# Patient Record
Sex: Female | Born: 1955
Health system: Southern US, Community
[De-identification: ages and names within clinical notes are randomized; demographics above are authoritative.]

## PROBLEM LIST (undated history)

## (undated) DIAGNOSIS — R17 Unspecified jaundice: Secondary | ICD-10-CM

## (undated) DIAGNOSIS — Z87898 Personal history of other specified conditions: Secondary | ICD-10-CM

## (undated) DIAGNOSIS — B54 Unspecified malaria: Secondary | ICD-10-CM

## (undated) HISTORY — DX: Unspecified malaria: B54

## (undated) HISTORY — DX: Personal history of other specified conditions: Z87.898

## (undated) HISTORY — DX: Unspecified jaundice: R17

---

## 1978-09-25 HISTORY — PX: GYNECOLOGIC CRYOSURGERY: SHX857

## 1989-09-25 DIAGNOSIS — B54 Unspecified malaria: Secondary | ICD-10-CM

## 1989-09-25 HISTORY — DX: Unspecified malaria: B54

## 1999-05-12 ENCOUNTER — Other Ambulatory Visit: Admission: RE | Admit: 1999-05-12 | Discharge: 1999-05-12 | Payer: Self-pay | Admitting: Obstetrics and Gynecology

## 1999-06-02 ENCOUNTER — Ambulatory Visit (HOSPITAL_COMMUNITY): Admission: RE | Admit: 1999-06-02 | Discharge: 1999-06-02 | Payer: Self-pay | Admitting: Obstetrics and Gynecology

## 1999-06-02 ENCOUNTER — Encounter: Payer: Self-pay | Admitting: Obstetrics and Gynecology

## 2000-07-30 ENCOUNTER — Other Ambulatory Visit: Admission: RE | Admit: 2000-07-30 | Discharge: 2000-07-30 | Payer: Self-pay | Admitting: Obstetrics and Gynecology

## 2001-04-11 ENCOUNTER — Encounter: Payer: Self-pay | Admitting: Obstetrics and Gynecology

## 2001-04-11 ENCOUNTER — Ambulatory Visit (HOSPITAL_COMMUNITY): Admission: RE | Admit: 2001-04-11 | Discharge: 2001-04-11 | Payer: Self-pay | Admitting: Obstetrics and Gynecology

## 2001-09-02 ENCOUNTER — Other Ambulatory Visit: Admission: RE | Admit: 2001-09-02 | Discharge: 2001-09-02 | Payer: Self-pay | Admitting: Obstetrics and Gynecology

## 2002-12-11 ENCOUNTER — Other Ambulatory Visit: Admission: RE | Admit: 2002-12-11 | Discharge: 2002-12-11 | Payer: Self-pay | Admitting: Obstetrics and Gynecology

## 2003-03-05 ENCOUNTER — Ambulatory Visit (HOSPITAL_COMMUNITY): Admission: RE | Admit: 2003-03-05 | Discharge: 2003-03-05 | Payer: Self-pay | Admitting: Obstetrics and Gynecology

## 2003-03-05 ENCOUNTER — Encounter: Payer: Self-pay | Admitting: Obstetrics and Gynecology

## 2003-12-28 ENCOUNTER — Other Ambulatory Visit: Admission: RE | Admit: 2003-12-28 | Discharge: 2003-12-28 | Payer: Self-pay | Admitting: Obstetrics and Gynecology

## 2004-11-17 ENCOUNTER — Ambulatory Visit (HOSPITAL_COMMUNITY): Admission: RE | Admit: 2004-11-17 | Discharge: 2004-11-17 | Payer: Self-pay | Admitting: Obstetrics and Gynecology

## 2005-01-12 ENCOUNTER — Other Ambulatory Visit: Admission: RE | Admit: 2005-01-12 | Discharge: 2005-01-12 | Payer: Self-pay | Admitting: *Deleted

## 2005-11-20 ENCOUNTER — Ambulatory Visit: Admission: RE | Admit: 2005-11-20 | Discharge: 2005-11-20 | Payer: Self-pay | Admitting: Family Medicine

## 2005-12-11 ENCOUNTER — Ambulatory Visit (HOSPITAL_COMMUNITY): Admission: RE | Admit: 2005-12-11 | Discharge: 2005-12-11 | Payer: Self-pay | Admitting: Obstetrics & Gynecology

## 2006-06-28 ENCOUNTER — Other Ambulatory Visit: Admission: RE | Admit: 2006-06-28 | Discharge: 2006-06-28 | Payer: Self-pay | Admitting: Obstetrics & Gynecology

## 2007-01-10 ENCOUNTER — Ambulatory Visit (HOSPITAL_COMMUNITY): Admission: RE | Admit: 2007-01-10 | Discharge: 2007-01-10 | Payer: Self-pay | Admitting: Obstetrics & Gynecology

## 2007-05-16 ENCOUNTER — Ambulatory Visit (HOSPITAL_COMMUNITY): Admission: RE | Admit: 2007-05-16 | Discharge: 2007-05-16 | Payer: Self-pay | Admitting: Pediatrics

## 2007-07-08 ENCOUNTER — Other Ambulatory Visit: Admission: RE | Admit: 2007-07-08 | Discharge: 2007-07-08 | Payer: Self-pay | Admitting: Obstetrics & Gynecology

## 2008-02-10 ENCOUNTER — Ambulatory Visit (HOSPITAL_COMMUNITY): Admission: RE | Admit: 2008-02-10 | Discharge: 2008-02-10 | Payer: Self-pay | Admitting: Obstetrics & Gynecology

## 2008-07-20 ENCOUNTER — Other Ambulatory Visit: Admission: RE | Admit: 2008-07-20 | Discharge: 2008-07-20 | Payer: Self-pay | Admitting: Obstetrics & Gynecology

## 2008-07-26 HISTORY — PX: COLONOSCOPY: SHX174

## 2008-07-27 ENCOUNTER — Ambulatory Visit: Payer: Self-pay | Admitting: Internal Medicine

## 2008-08-13 ENCOUNTER — Ambulatory Visit: Payer: Self-pay | Admitting: Internal Medicine

## 2008-08-13 ENCOUNTER — Encounter: Payer: Self-pay | Admitting: Internal Medicine

## 2008-08-17 ENCOUNTER — Encounter: Payer: Self-pay | Admitting: Internal Medicine

## 2009-02-15 ENCOUNTER — Ambulatory Visit (HOSPITAL_COMMUNITY): Admission: RE | Admit: 2009-02-15 | Discharge: 2009-02-15 | Payer: Self-pay | Admitting: Obstetrics & Gynecology

## 2010-03-21 ENCOUNTER — Ambulatory Visit (HOSPITAL_COMMUNITY): Admission: RE | Admit: 2010-03-21 | Discharge: 2010-03-21 | Payer: Self-pay | Admitting: Obstetrics & Gynecology

## 2010-10-16 ENCOUNTER — Encounter: Payer: Self-pay | Admitting: Pediatrics

## 2011-03-14 ENCOUNTER — Other Ambulatory Visit: Payer: Self-pay | Admitting: Obstetrics & Gynecology

## 2011-03-14 DIAGNOSIS — Z1231 Encounter for screening mammogram for malignant neoplasm of breast: Secondary | ICD-10-CM

## 2011-03-30 ENCOUNTER — Ambulatory Visit (HOSPITAL_COMMUNITY): Payer: Self-pay

## 2011-03-30 ENCOUNTER — Ambulatory Visit (HOSPITAL_COMMUNITY)
Admission: RE | Admit: 2011-03-30 | Discharge: 2011-03-30 | Disposition: A | Discharge status: Home / Self Care | Payer: 59 | Source: Ambulatory Visit | Attending: Obstetrics & Gynecology | Admitting: Obstetrics & Gynecology

## 2011-03-30 DIAGNOSIS — Z1231 Encounter for screening mammogram for malignant neoplasm of breast: Secondary | ICD-10-CM

## 2012-01-04 ENCOUNTER — Encounter: Payer: Self-pay | Admitting: Internal Medicine

## 2012-04-15 ENCOUNTER — Other Ambulatory Visit: Payer: Self-pay | Admitting: Obstetrics & Gynecology

## 2012-04-15 DIAGNOSIS — Z1231 Encounter for screening mammogram for malignant neoplasm of breast: Secondary | ICD-10-CM

## 2012-05-06 ENCOUNTER — Ambulatory Visit (HOSPITAL_COMMUNITY)
Admission: RE | Admit: 2012-05-06 | Discharge: 2012-05-06 | Disposition: A | Discharge status: Home / Self Care | Payer: 59 | Source: Ambulatory Visit | Attending: Obstetrics & Gynecology | Admitting: Obstetrics & Gynecology

## 2012-05-06 DIAGNOSIS — Z1231 Encounter for screening mammogram for malignant neoplasm of breast: Secondary | ICD-10-CM | POA: Insufficient documentation

## 2012-11-06 ENCOUNTER — Encounter: Payer: Self-pay | Admitting: Certified Nurse Midwife

## 2012-12-16 ENCOUNTER — Ambulatory Visit: Payer: Self-pay | Admitting: Obstetrics & Gynecology

## 2012-12-25 ENCOUNTER — Ambulatory Visit: Payer: Self-pay | Admitting: Obstetrics & Gynecology

## 2012-12-30 ENCOUNTER — Ambulatory Visit (INDEPENDENT_AMBULATORY_CARE_PROVIDER_SITE_OTHER): Payer: 59 | Admitting: Obstetrics & Gynecology

## 2012-12-30 ENCOUNTER — Encounter: Payer: Self-pay | Admitting: Obstetrics & Gynecology

## 2012-12-30 VITALS — BP 104/64 | Ht 66.5 in | Wt 135.6 lb

## 2012-12-30 DIAGNOSIS — Z01419 Encounter for gynecological examination (general) (routine) without abnormal findings: Secondary | ICD-10-CM

## 2012-12-30 DIAGNOSIS — Z0000 Encounter for general adult medical examination without abnormal findings: Secondary | ICD-10-CM

## 2012-12-30 LAB — POCT URINALYSIS DIPSTICK
Protein, UA: NEGATIVE
Urobilinogen, UA: NEGATIVE

## 2012-12-30 MED ORDER — ZOLPIDEM TARTRATE 10 MG PO TABS: 10.0000 mg | ORAL_TABLET | Freq: Every evening | ORAL | Status: DC | PRN

## 2012-12-30 NOTE — Progress Notes (Signed)
57 y.o. [id number] MarriedCaucasianF here for annual exam.  Doing well.  No vaginal bleeding.  Planinng on retiring from [employer] in July.  Wants to work two months on/two off but hasn't found this situation yet.  Patient's last menstrual period was 08/26/2011.          Sexually active: yes  The current method of family planning is post menopausal status.    Exercising: yes  running Smoker:  no  Health Maintenance: Pap:  12/11/11 WNL MMG:  05/06/12 normal Colonoscopy:  11/09 repeat 7 years (pt states she is NOT doing this in seven years and will discuss with Dr. Juanda Chance) BMD:   none TDaP:  2007 Labs: 2013 here   reports that she has never smoked. She does not have any smokeless tobacco history on file. She reports that she drinks about 3.5 ounces of alcohol per week. She reports that she does not use illicit drugs.  Past Medical History  Diagnosis Date  . Malaria 1991  . H/O abnormal Pap smear     Past Surgical History  Procedure Laterality Date  . 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 Colonoscopy  11/09    Current Outpatient Prescriptions  Medication Sig Dispense Refill  . Multiple Vitamins-Minerals (MULTIVITAMIN PO) Take by mouth daily.      Marland Kitchen zolpidem (AMBIEN) 10 MG tablet Take 10 mg by mouth at bedtime as needed for sleep.       No current facility-administered medications for this visit.    Family History  Problem Relation Age of Onset  .       ROS:  Pertinent items are noted in HPI.  Otherwise, a comprehensive ROS was negative.  Exam:   BP 104/64  Ht 5' 6.5" (1.689 m)  Wt 135 lb 9.6 oz (61.508 kg)  BMI 21.56 kg/m2  LMP 08/26/2011  Height:   Height: 5' 6.5" (168.9 cm)  Ht Readings from Last 3 Encounters:  12/30/12 5' 6.5" (1.689 m)    General appearance: alert, cooperative and appears stated age Head: Normocephalic, without obvious abnormality, atraumatic Neck: no adenopathy, supple, symmetrical, trachea midline and thyroid normal to inspection and  palpation Lungs: clear to auscultation bilaterally Breasts: normal appearance, no masses or tenderness Heart: regular rate and rhythm Abdomen: soft, non-tender; bowel sounds normal; no masses,  no organomegaly Extremities: extremities normal, atraumatic, no cyanosis or edema Skin: Skin color, texture, turgor normal. No rashes or lesions Lymph nodes: Cervical, supraclavicular, and axillary nodes normal. No abnormal inguinal nodes palpated Neurologic: Grossly normal   Pelvic: External genitalia:  no lesions              Urethra:  normal appearing urethra with no masses, tenderness or lesions              Bartholins and Skenes: normal                 Vagina: normal appearing vagina with normal color and discharge, no lesions              Cervix: no lesions              Pap taken: yes Bimanual Exam:  Uterus:  normal size, contour, position, consistency, mobility, non-tender              Adnexa: normal adnexa               Rectovaginal: Confirms  Anus:  normal sphincter tone, no lesions  A:  Well Woman with normal exam, PMP no HRT Insomnia H/O LS&A Remote H/O CIN 1 s/p cryo 1980  P:   Mammogram yearly, D/W pt 3D imaging pap smear with HR HPV today Labs done 2013--all normal Ambien 10mg  qhs prn #30/5RF return annually or prn  An After Visit Summary was printed and given to the patient.

## 2012-12-30 NOTE — Patient Instructions (Signed)

## 2014-02-23 ENCOUNTER — Encounter: Payer: Self-pay | Admitting: Obstetrics & Gynecology

## 2014-02-23 ENCOUNTER — Ambulatory Visit (INDEPENDENT_AMBULATORY_CARE_PROVIDER_SITE_OTHER): Payer: 59 | Admitting: Obstetrics & Gynecology

## 2014-02-23 ENCOUNTER — Other Ambulatory Visit: Payer: Self-pay | Admitting: Obstetrics & Gynecology

## 2014-02-23 VITALS — BP 102/62 | HR 56 | Resp 16 | Ht 66.25 in | Wt 137.0 lb

## 2014-02-23 DIAGNOSIS — Z01419 Encounter for gynecological examination (general) (routine) without abnormal findings: Secondary | ICD-10-CM

## 2014-02-23 DIAGNOSIS — Z1231 Encounter for screening mammogram for malignant neoplasm of breast: Secondary | ICD-10-CM

## 2014-02-23 MED ORDER — ZOLPIDEM TARTRATE 10 MG PO TABS: 10.0000 mg | ORAL_TABLET | Freq: Every evening | ORAL | Status: DC | PRN

## 2014-02-23 NOTE — Patient Instructions (Signed)

## 2014-02-23 NOTE — Progress Notes (Signed)
58 y.o. [id number] MarriedCaucasianF here for annual exam.  Doing work with [employer] for three days a week.  Just working prn.  Betsy Coder is in [place] on study abroad.  Pt and her husband went to visit.  No vaginal bleeding.    Patient's last menstrual period was 08/26/2011.          Sexually active: yes  The current method of family planning is post menopausal status.    Exercising: yes  running Smoker:  no  Health Maintenance: Pap:  12/30/12 WNL/negative HR HPV History of abnormal Pap:  yes MMG:  05/06/12-normal Colonoscopy:  2009-repeat in 10 years BMD:   none TDaP:  2007 Screening Labs: declined, Hb today: declined, Urine today: declined   reports that she has never smoked. She has never used smokeless tobacco. She reports that she drinks about 3.5 - 5 ounces of alcohol per week. She reports that she does not use illicit drugs.  Past Medical History  Diagnosis Date  . Malaria 1991  . H/O abnormal Pap smear     Past Surgical History  Procedure Laterality Date  . 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 Colonoscopy  11/09    Current Outpatient Prescriptions  Medication Sig Dispense Refill  . zolpidem (AMBIEN) 10 MG tablet Take 1 tablet (10 mg total) by mouth at bedtime as needed for sleep.  30 tablet  5  . Multiple Vitamins-Minerals (MULTIVITAMIN PO) Take by mouth daily.       No current facility-administered medications for this visit.    Family History  Problem Relation Age of Onset  . Kidney disease Maternal Grandfather     polycystic kidneys    ROS:  Pertinent items are noted in HPI.  Otherwise, a comprehensive ROS was negative.  Exam:   BP 102/62  Pulse 56  Resp 16  Ht 5' 6.25" (1.683 m)  Wt 137 lb (62.143 kg)  BMI 21.94 kg/m2  LMP 08/26/2011   Height: 5' 6.25" (168.3 cm)  Ht Readings from Last 3 Encounters:  02/23/14 5' 6.25" (1.683 m)  12/30/12 5' 6.5" (1.689 m)    General appearance: alert, cooperative and appears stated age Head: Normocephalic,  without obvious abnormality, atraumatic Neck: no adenopathy, supple, symmetrical, trachea midline and thyroid normal to inspection and palpation Lungs: clear to auscultation bilaterally Breasts: normal appearance, no masses or tenderness Heart: regular rate and rhythm Abdomen: soft, non-tender; bowel sounds normal; no masses,  no organomegaly Extremities: extremities normal, atraumatic, no cyanosis or edema Skin: Skin color, texture, turgor normal. No rashes or lesions Lymph nodes: Cervical, supraclavicular, and axillary nodes normal. No abnormal inguinal nodes palpated Neurologic: Grossly normal   Pelvic: External genitalia:  no lesions              Urethra:  normal appearing urethra with no masses, tenderness or lesions              Bartholins and Skenes: normal                 Vagina: normal appearing vagina with normal color and discharge, no lesions              Cervix: no lesions              Pap taken: yes Bimanual Exam:  Uterus:  normal size, contour, position, consistency, mobility, non-tender              Adnexa: normal adnexa and no mass, fullness, tenderness  Rectovaginal: Declines  A:  Well Woman with normal exam PMP, no HRT Insomnia  P:   Mammogram due.  Pt has appt scheduled. pap smear with neg HR HPV 2014.  No Pap today.  Ambien 10mg  qhs prn #30/3RF return annually or prn  An After Visit Summary was printed and given to the patient.

## 2014-02-26 ENCOUNTER — Ambulatory Visit (HOSPITAL_COMMUNITY)
Admission: RE | Admit: 2014-02-26 | Discharge: 2014-02-26 | Disposition: A | Discharge status: Home / Self Care | Payer: 59 | Source: Ambulatory Visit | Attending: Obstetrics & Gynecology | Admitting: Obstetrics & Gynecology

## 2014-02-26 DIAGNOSIS — Z1231 Encounter for screening mammogram for malignant neoplasm of breast: Secondary | ICD-10-CM | POA: Insufficient documentation

## 2014-04-10 ENCOUNTER — Ambulatory Visit: Payer: 59 | Admitting: Obstetrics & Gynecology

## 2014-07-27 ENCOUNTER — Encounter: Payer: Self-pay | Admitting: Obstetrics & Gynecology

## 2015-03-01 ENCOUNTER — Other Ambulatory Visit: Payer: Self-pay | Admitting: Obstetrics & Gynecology

## 2015-03-01 DIAGNOSIS — Z1231 Encounter for screening mammogram for malignant neoplasm of breast: Secondary | ICD-10-CM

## 2015-03-18 ENCOUNTER — Ambulatory Visit (HOSPITAL_COMMUNITY)
Admission: RE | Admit: 2015-03-18 | Discharge: 2015-03-18 | Disposition: A | Discharge status: Home / Self Care | Payer: 59 | Source: Ambulatory Visit | Attending: Obstetrics & Gynecology | Admitting: Obstetrics & Gynecology

## 2015-03-18 DIAGNOSIS — Z1231 Encounter for screening mammogram for malignant neoplasm of breast: Secondary | ICD-10-CM | POA: Diagnosis present

## 2015-04-06 ENCOUNTER — Telehealth: Payer: Self-pay | Admitting: Obstetrics and Gynecology

## 2015-04-06 NOTE — Telephone Encounter (Signed)
Left patient a message to call back to reschedule a future appointment that was cancelled by the provider. °

## 2015-04-26 DIAGNOSIS — R17 Unspecified jaundice: Secondary | ICD-10-CM

## 2015-04-26 HISTORY — DX: Unspecified jaundice: R17

## 2015-05-06 ENCOUNTER — Ambulatory Visit (INDEPENDENT_AMBULATORY_CARE_PROVIDER_SITE_OTHER): Payer: 59 | Admitting: Obstetrics and Gynecology

## 2015-05-06 ENCOUNTER — Other Ambulatory Visit: Payer: Self-pay | Admitting: Obstetrics and Gynecology

## 2015-05-06 ENCOUNTER — Encounter: Payer: Self-pay | Admitting: Obstetrics and Gynecology

## 2015-05-06 VITALS — BP 110/70 | HR 58 | Resp 14 | Ht 66.0 in | Wt 134.0 lb

## 2015-05-06 DIAGNOSIS — Z01419 Encounter for gynecological examination (general) (routine) without abnormal findings: Secondary | ICD-10-CM | POA: Diagnosis not present

## 2015-05-06 DIAGNOSIS — Z0000 Encounter for general adult medical examination without abnormal findings: Secondary | ICD-10-CM | POA: Diagnosis not present

## 2015-05-06 DIAGNOSIS — R319 Hematuria, unspecified: Secondary | ICD-10-CM

## 2015-05-06 DIAGNOSIS — IMO0002 Reserved for concepts with insufficient information to code with codable children: Secondary | ICD-10-CM

## 2015-05-06 DIAGNOSIS — N952 Postmenopausal atrophic vaginitis: Secondary | ICD-10-CM

## 2015-05-06 DIAGNOSIS — N941 Dyspareunia: Secondary | ICD-10-CM

## 2015-05-06 LAB — COMPREHENSIVE METABOLIC PANEL
ALT: 16 U/L (ref 6–29)
AST: 23 U/L (ref 10–35)
Albumin: 4.6 g/dL (ref 3.6–5.1)
Alkaline Phosphatase: 53 U/L (ref 33–130)
BILIRUBIN TOTAL: 1.6 mg/dL — AB (ref 0.2–1.2)
BUN: 16 mg/dL (ref 7–25)
CALCIUM: 9.8 mg/dL (ref 8.6–10.4)
CHLORIDE: 102 mmol/L (ref 98–110)
CO2: 31 mmol/L (ref 20–31)
Creat: 0.85 mg/dL (ref 0.50–1.05)
Glucose, Bld: 100 mg/dL — ABNORMAL HIGH (ref 65–99)
POTASSIUM: 3.8 mmol/L (ref 3.5–5.3)
SODIUM: 142 mmol/L (ref 135–146)
TOTAL PROTEIN: 7 g/dL (ref 6.1–8.1)

## 2015-05-06 LAB — POCT URINALYSIS DIPSTICK
Bilirubin, UA: NEGATIVE
Glucose, UA: NEGATIVE
Ketones, UA: NEGATIVE
LEUKOCYTES UA: NEGATIVE
Nitrite, UA: NEGATIVE
PH UA: 7
PROTEIN UA: NEGATIVE
UROBILINOGEN UA: NEGATIVE

## 2015-05-06 LAB — HEMOGLOBIN, FINGERSTICK: Hemoglobin, fingerstick: 13.7 g/dL (ref 12.0–16.0)

## 2015-05-06 LAB — TSH: TSH: 0.867 u[IU]/mL (ref 0.350–4.500)

## 2015-05-06 MED ORDER — ESTRADIOL 0.1 MG/GM VA CREA: TOPICAL_CREAM | VAGINAL | Status: DC

## 2015-05-06 MED ORDER — ZOLPIDEM TARTRATE 10 MG PO TABS: 10.0000 mg | ORAL_TABLET | Freq: Every evening | ORAL | Status: DC | PRN

## 2015-05-06 NOTE — Patient Instructions (Signed)
If you have vaginal bleeding with intercourse after being on the estrace cream for one month, please come for evaluation  EXERCISE AND DIET:  We recommended that you start or continue a regular exercise program for good health. Regular exercise means any activity that makes your heart beat faster and makes you sweat.  We recommend exercising at least 30 minutes per day at least 3 days a week, preferably 4 or 5.  We also recommend a diet low in fat and sugar.  Inactivity, poor dietary choices and obesity can cause diabetes, heart attack, stroke, and kidney damage, among others.    ALCOHOL AND SMOKING:  Women should limit their alcohol intake to no more than 7 drinks/beers/glasses of wine (combined, not each!) per week. Moderation of alcohol intake to this level decreases your risk of breast cancer and liver damage. And of course, no recreational drugs are part of a healthy lifestyle.  And absolutely no smoking or even second hand smoke. Most people know smoking can cause heart and lung diseases, but did you know it also contributes to weakening of your bones? Aging of your skin?  Yellowing of your teeth and nails?  CALCIUM AND VITAMIN D:  Adequate intake of calcium and Vitamin D are recommended.  The recommendations for exact amounts of these supplements seem to change often, but generally speaking 600 mg of calcium (either carbonate or citrate) and 800 units of Vitamin D per day seems prudent. Certain women may benefit from higher intake of Vitamin D.  If you are among these women, your doctor will have told you during your visit.    PAP SMEARS:  Pap smears, to check for cervical cancer or precancers,  have traditionally been done yearly, although recent scientific advances have shown that most women can have pap smears less often.  However, every woman still should have a physical exam from her gynecologist every year. It will include a breast check, inspection of the vulva and vagina to check for abnormal  growths or skin changes, a visual exam of the cervix, and then an exam to evaluate the size and shape of the uterus and ovaries.  And after 57 years of age, a rectal exam is indicated to check for rectal cancers. We will also provide age appropriate advice regarding health maintenance, like when you should have certain vaccines, screening for sexually transmitted diseases, bone density testing, colonoscopy, mammograms, etc.   MAMMOGRAMS:  All women over 13 years old should have a yearly mammogram. Many facilities now offer a "3D" mammogram, which may cost around $50 extra out of pocket. If possible,  we recommend you accept the option to have the 3D mammogram performed.  It both reduces the number of women who will be called back for extra views which then turn out to be normal, and it is better than the routine mammogram at detecting truly abnormal areas.    COLONOSCOPY:  Colonoscopy to screen for colon cancer is recommended for all women at age 57.  We know, you hate the idea of the prep.  We agree, BUT, having colon cancer and not knowing it is worse!!  Colon cancer so often starts as a polyp that can be seen and removed at colonscopy, which can quite literally save your life!  And if your first colonoscopy is normal and you have no family history of colon cancer, most women don't have to have it again for 10 years.  Once every ten years, you can do something that may  end up saving your life, right?  We will be happy to help you get it scheduled when you are ready.  Be sure to check your insurance coverage so you understand how much it will cost.  It may be covered as a preventative service at no cost, but you should check your particular policy.

## 2015-05-06 NOTE — Progress Notes (Signed)
Patient ID: Sandra Barnes " ABI SHOULTS, female   DOB: 1956/09/03, 59 y.o.   MRN: [id number] 59 y.o. [id number] MarriedCaucasianF here for annual exam. Pt c/o painful intercourse with some bleeding. She uses a lubricant, helps a lot. Occasionally notices a small amount of blood with wiping and tenderness. Feels like she tears.  Other than that no vaginal bleeding. Only occasional vasomotor symptoms. She also would like a refill on her Ambien, she uses it with travel, usually breaks it in 1/2, uses about 30 tablets in 6 months.   Patient's last menstrual period was 08/26/2011.          Sexually active: Yes.    The current method of family planning is post menopausal status.    Exercising: Yes.    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 Kids are 41 and 87, 26 year old son at home, 39 year old son in college.    reports that she has never smoked. She has never used smokeless tobacco. She reports that she drinks about 3.5 - 5.0 oz of alcohol per week. She reports that she does not use illicit drugs.  Past Medical History  Diagnosis Date  . Malaria 1991  . H/O abnormal Pap smear     Past Surgical History  Procedure Laterality Date  . Colonoscopy  11/09  . Gynecologic cryosurgery  1980    CIN1    Current Outpatient Prescriptions  Medication Sig Dispense Refill  . zolpidem (AMBIEN) 10 MG tablet Take 1 tablet (10 mg total) by mouth at bedtime as needed for sleep. 30 tablet 1  . estradiol (ESTRACE) 0.1 MG/GM vaginal cream 1 gram vaginally every night x 1 week, then twice weekly at hs 42.5 g 2   No current facility-administered medications for this visit.    Family History  Problem Relation Age of Onset  .  Kidney disease Maternal Grandfather     polycystic kidneys    ROS:  Pertinent items are noted in HPI.  Otherwise, a comprehensive ROS was negative.  Exam:   BP 110/70 mmHg  Pulse 58  Resp 14  Ht 5\' 6"  (1.676 m)  Wt 134 lb (60.782 kg)  BMI 21.64 kg/m2  LMP 08/26/2011  Weight change: @WEIGHTCHANGE @ Height:   Height: 5\' 6"  (167.6 cm)  Ht Readings from Last 3 Encounters:  05/06/15 5\' 6"  (1.676 m)  02/23/14 5' 6.25" (1.683 m)  12/30/12 5' 6.5" (1.689 m)    General appearance: alert, cooperative and appears stated a Head: Normocephalic, without obvious abnormality, atraumatic Neck: no adenopathy, supple, symmetrical, trachea midline and thyroid normal sized, no masses CV: RRR, no murmurs Lungs: clear to auscultation bilaterally Breasts: no lumps, dimpling or retraction Heart: regular rate and rhythm Abdomen: soft, non-tender; bowel sounds normal; no masses,  no organomegaly Extremities: extremities normal, atraumatic, no cyanosis or edema Skin: Skin color, texture, turgor normal. No rashes or lesions Lymph nodes: Cervical, supraclavicular, and axillary nodes normal. No abnormal inguinal nodes palpated Neurologic: Grossly normal   Pelvic: External genitalia:  no lesions              Urethra:  normal appearing urethra  with no masses, tenderness or lesions              Bartholins and Skenes: normal                 Vagina: normal appearing vagina with mild atrophy, normal color and discharge, no lesions              Cervix: no lesions              Pap taken:no Bimanual Exam:  Uterus: normal sized, mobile, not tender              Adnexa:no masses or tenderness               Rectovaginal: Confirms               Anus:  normal sphincter tone, no lesions  Chaperone was present for exam.  A:  Well Woman with normal exam  Vaginal atrophy  Dyspareunia with occasional spotting (feels like a tear)  Hematuria  P:   No pap this year  Start estrace cream, if she continues to spot after  starting the estrace cream, she needs further evaluation  Use a lubricant with intercourse  TSH, CMP  Lipids were great in the last few years  Mammogram UTD  Send urine for ua, c&s

## 2015-05-07 LAB — URINALYSIS, MICROSCOPIC ONLY
BACTERIA UA: NONE SEEN [HPF]
CASTS: NONE SEEN [LPF]
CRYSTALS: NONE SEEN [HPF]
SQUAMOUS EPITHELIAL / LPF: NONE SEEN [HPF] (ref <=5)
WBC, UA: NONE SEEN WBC/HPF (ref <=5)
YEAST: NONE SEEN [HPF]

## 2015-05-07 LAB — URINE CULTURE
Colony Count: NO GROWTH
Organism ID, Bacteria: NO GROWTH

## 2015-05-10 LAB — BILIRUBIN, FRACTIONATED(TOT/DIR/INDIR)
Bilirubin, Direct: 0.3 mg/dL — ABNORMAL HIGH (ref <=0.2)
Indirect Bilirubin: 1.2 mg/dL (ref 0.2–1.2)
Total Bilirubin: 1.5 mg/dL — ABNORMAL HIGH (ref 0.2–1.2)

## 2015-05-11 ENCOUNTER — Encounter: Payer: Self-pay | Admitting: Obstetrics and Gynecology

## 2015-05-11 ENCOUNTER — Telehealth: Payer: Self-pay

## 2015-05-11 NOTE — Telephone Encounter (Signed)
Patient was notified of results by Starlyn Skeans, [place] on 05/10/2015. Please see result note below.   Notes Recorded by Nicholes Rough, CMA on 05/10/2015 at 9:09 AM Patient is aware of her results. She is not concerned about her total bilirubin since her AST/ ALT are normal. -eh Notes Recorded by Nunzio Cobbs, MD on 05/09/2015 at 3:55 PM This is Dr. Quincy Simmonds reviewing Dr. Gentry Fitz in box in her absence.  The final urine micro showed 0-2 RBCs per high power field.  The final urine culture was negative.  No further evaluation needed at this time.   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 I will see if Myriam Jacobson can add a direct and indirect bilirubin to her labs. This will be something she should ultimately f/u with her primary, but we will get those results first.  Thanks  Routing to provider for final review. Patient agreeable to disposition. Will close encounter.   Patient aware provider will review message and nurse will return call if any additional advice or change of disposition.

## 2015-05-11 NOTE — Telephone Encounter (Signed)
-----   Message from Nunzio Cobbs, MD sent at 05/10/2015  8:37 PM EDT ----- This is Dr. Quincy Simmonds reviewing the in box of Dr. Talbert Nan.  Please share results of bilirubin with patient.  Total bilirubin 1.5, direct bilirubin 0.3, and indirect bilirubin 1.2.  Dr. Talbert Nan would like Dr. Jonny Ruiz to follow up with her PCP.

## 2015-05-12 ENCOUNTER — Ambulatory Visit: Payer: 59 | Admitting: Obstetrics and Gynecology

## 2015-05-26 ENCOUNTER — Telehealth: Payer: Self-pay | Admitting: Obstetrics and Gynecology

## 2015-05-26 MED ORDER — FLUCONAZOLE 150 MG PO TABS: ORAL_TABLET | ORAL | Status: DC

## 2015-05-26 NOTE — Telephone Encounter (Signed)
Spoke with patient. Advised of message as seen below from Dr.Jertson. Patient is agreeable and verbalizes understanding.  Routing to provider for final review. Patient agreeable to disposition. Will close encounter.  

## 2015-05-26 NOTE — Telephone Encounter (Signed)
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 Requesting Diflucan 150 mg #2 0RF be sent to pharmacy on file as [place] going [place] the [place].  Routing to [place] for review.

## 2015-05-26 NOTE — Telephone Encounter (Signed)
Patient has a yeast infection and would like to have a prescription for diflucan sent to pharmacy. 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 Also requested that two pills be authorized because she will be going out of the country.

## 2015-05-26 NOTE — Telephone Encounter (Signed)
Please let the patient know the script has been sent. If she isn't feeling better after treatment she should come in for evaluation.

## 2015-10-15 MED FILL — ZOLPIDEM TARTRATE 10 MG TAB: 10 | 30 days supply | Qty: 30 | Fill #1

## 2015-10-16 ENCOUNTER — Encounter: Payer: Self-pay | Admitting: Gastroenterology

## 2015-12-21 ENCOUNTER — Encounter: Payer: Self-pay | Admitting: Internal Medicine

## 2016-04-10 MED FILL — ESTRACE 0.01% CREAM: 0.1 | 30 days supply | Qty: 43 | Fill #2

## 2016-05-12 ENCOUNTER — Other Ambulatory Visit: Payer: Self-pay | Admitting: Obstetrics and Gynecology

## 2016-05-12 DIAGNOSIS — Z1231 Encounter for screening mammogram for malignant neoplasm of breast: Secondary | ICD-10-CM

## 2016-05-17 ENCOUNTER — Ambulatory Visit: Payer: 59 | Admitting: Obstetrics and Gynecology

## 2016-05-24 ENCOUNTER — Ambulatory Visit
Admission: RE | Admit: 2016-05-24 | Discharge: 2016-05-24 | Disposition: A | Discharge status: Home / Self Care | Payer: 59 | Source: Ambulatory Visit | Attending: Obstetrics and Gynecology | Admitting: Obstetrics and Gynecology

## 2016-05-24 DIAGNOSIS — Z1231 Encounter for screening mammogram for malignant neoplasm of breast: Secondary | ICD-10-CM | POA: Diagnosis not present

## 2016-05-31 ENCOUNTER — Ambulatory Visit (INDEPENDENT_AMBULATORY_CARE_PROVIDER_SITE_OTHER): Payer: 59 | Admitting: Obstetrics and Gynecology

## 2016-05-31 ENCOUNTER — Encounter: Payer: Self-pay | Admitting: Obstetrics and Gynecology

## 2016-05-31 VITALS — BP 100/60 | HR 76 | Resp 14 | Ht 66.25 in | Wt 134.0 lb

## 2016-05-31 DIAGNOSIS — Z124 Encounter for screening for malignant neoplasm of cervix: Secondary | ICD-10-CM | POA: Diagnosis not present

## 2016-05-31 DIAGNOSIS — Z1151 Encounter for screening for human papillomavirus (HPV): Secondary | ICD-10-CM | POA: Diagnosis not present

## 2016-05-31 DIAGNOSIS — Z23 Encounter for immunization: Secondary | ICD-10-CM

## 2016-05-31 DIAGNOSIS — Z0000 Encounter for general adult medical examination without abnormal findings: Secondary | ICD-10-CM | POA: Diagnosis not present

## 2016-05-31 DIAGNOSIS — Z01419 Encounter for gynecological examination (general) (routine) without abnormal findings: Secondary | ICD-10-CM

## 2016-05-31 LAB — COMPREHENSIVE METABOLIC PANEL
ALBUMIN: 4.6 g/dL (ref 3.6–5.1)
ALT: 15 U/L (ref 6–29)
AST: 23 U/L (ref 10–35)
Alkaline Phosphatase: 45 U/L (ref 33–130)
BILIRUBIN TOTAL: 1.6 mg/dL — AB (ref 0.2–1.2)
BUN: 14 mg/dL (ref 7–25)
CALCIUM: 9.8 mg/dL (ref 8.6–10.4)
CO2: 29 mmol/L (ref 20–31)
Chloride: 101 mmol/L (ref 98–110)
Creat: 0.78 mg/dL (ref 0.50–1.05)
Glucose, Bld: 86 mg/dL (ref 65–99)
Potassium: 4.1 mmol/L (ref 3.5–5.3)
Sodium: 138 mmol/L (ref 135–146)
Total Protein: 7.1 g/dL (ref 6.1–8.1)

## 2016-05-31 LAB — LIPID PANEL
Cholesterol: 189 mg/dL (ref 125–200)
HDL: 108 mg/dL (ref >=46)
LDL CALC: 71 mg/dL (ref <130)
TRIGLYCERIDES: 48 mg/dL (ref <150)
Total CHOL/HDL Ratio: 1.8 Ratio (ref <=5.0)
VLDL: 10 mg/dL (ref <30)

## 2016-05-31 MED ORDER — ZOLPIDEM TARTRATE 10 MG PO TABS: 10.0000 mg | ORAL_TABLET | Freq: Every evening | ORAL | 1 refills | Status: DC | PRN

## 2016-05-31 MED ORDER — ESTRADIOL 0.1 MG/GM VA CREA: TOPICAL_CREAM | VAGINAL | 1 refills | Status: DC

## 2016-05-31 NOTE — Patient Instructions (Signed)

## 2016-05-31 NOTE — Progress Notes (Signed)
60 y.o. [id number] MarriedCaucasianF here for annual exam.  No more vaginal bleeding. She started on estrogen cream last year, no more pain with intercourse.     Patient's last menstrual period was 08/26/2011.          Sexually active: Yes.    The current method of family planning is post menopausal status.    Exercising: Yes.    runnning  Smoker:  no  Health Maintenance: Pap:  12-30-12 WNL NEG HR HPV  History of abnormal Pap:  Yes-years ago MMG:  05-24-16 WNL Colonoscopy:  08-03-08  BMD:   None  TDaP: 2007 Gardasil: N/A   reports that she has never smoked. She has never used smokeless tobacco. She reports that she drinks about 3.5 - 5.0 oz of alcohol per week . She reports that she does not use drugs. 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 Kids are 44 and 74. Younger son finished college and is in [place] studying monkeys, genetic testing. Older son is living at home, working as a Quarry manager. Husband is also a FP, MD  Past Medical History:  Diagnosis Date  . Elevated bilirubin August 2016  . H/O abnormal Pap smear   . Malaria 1991    Past Surgical History:  Procedure Laterality Date  . COLONOSCOPY  11/09  . 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 estradiol (ESTRACE) 0.1 MG/GM vaginal cream 1 gram vaginally every night x 1 week, then twice weekly at hs 42.5 g 2  . zolpidem (AMBIEN) 10 MG tablet Take 1 tablet (10 mg total) by mouth at bedtime as needed for sleep. 30 tablet 1   No current facility-administered medications for this visit.     Family History  Problem Relation Age of Onset  . Kidney disease Maternal Grandfather     polycystic kidneys    Review of Systems  Constitutional: Negative.   HENT: Negative.   Eyes: Negative.   Respiratory: Negative.   Cardiovascular: Negative.   Gastrointestinal: Negative.   Endocrine: Negative.   Genitourinary: Negative.   Musculoskeletal: Negative.   Skin:  Negative.   Allergic/Immunologic: Negative.   Neurological: Negative.   Psychiatric/Behavioral: Negative.     Exam:   BP 100/60 (BP Location: Right Arm, Patient Position: Sitting, Cuff Size: Normal)   Pulse 76   Resp 14   Ht 5' 6.25" (1.683 m)   Wt 134 lb (60.8 kg)   LMP 08/26/2011   BMI 21.47 kg/m   Weight change: @WEIGHTCHANGE @ Height:   Height: 5' 6.25" (168.3 cm)  Ht Readings from Last 3 Encounters:  05/31/16 5' 6.25" (1.683 m)  05/06/15 5\' 6"  (1.676 m)  02/23/14 5' 6.25" (1.683 m)    General appearance: alert, cooperative and appears stated age Head: Normocephalic, without obvious abnormality, atraumatic Neck: no adenopathy, supple, symmetrical, trachea midline and thyroid normal to inspection and palpation Lungs: clear to auscultation bilaterally Breasts: normal appearance, no masses or tenderness Heart: regular rate and rhythm Abdomen: soft, non-tender; bowel sounds normal; no masses,  no organomegaly Extremities: extremities normal, atraumatic, no cyanosis or edema Skin: Skin color, texture, turgor normal. No rashes or lesions Lymph nodes: Cervical, supraclavicular, and axillary nodes normal. No abnormal inguinal nodes palpated Neurologic: Grossly normal   Pelvic: External genitalia:  no lesions              Urethra:  normal appearing urethra with no masses, tenderness  or lesions              Bartholins and Skenes: normal                 Vagina: normal appearing vagina with normal color and discharge, no lesions              Cervix: no lesions               Bimanual Exam:  Uterus:  normal size, contour, position, consistency, mobility, non-tender, anteverted              Adnexa: no mass, fullness, tenderness               Rectovaginal: Confirms               Anus:  normal sphincter tone, no lesions  Chaperone was present for exam.  A:  Well Woman with normal exam  P:   CMP, lipids and vit d  Pap with hpv  Mammogram UTD  Colonoscopy UTD  TDAP

## 2016-06-01 DIAGNOSIS — Z23 Encounter for immunization: Secondary | ICD-10-CM | POA: Diagnosis not present

## 2016-06-01 LAB — VITAMIN D 25 HYDROXY (VIT D DEFICIENCY, FRACTURES): Vit D, 25-Hydroxy: 38 ng/mL (ref 30–100)

## 2016-06-02 LAB — IPS PAP TEST WITH HPV

## 2016-07-20 MED FILL — ZOLPIDEM TARTRATE 10 MG TAB: 10 | 30 days supply | Qty: 30 | Fill #0

## 2016-08-14 DIAGNOSIS — H5203 Hypermetropia, bilateral: Secondary | ICD-10-CM | POA: Diagnosis not present

## 2016-08-14 DIAGNOSIS — H524 Presbyopia: Secondary | ICD-10-CM | POA: Diagnosis not present

## 2016-08-14 DIAGNOSIS — H52223 Regular astigmatism, bilateral: Secondary | ICD-10-CM | POA: Diagnosis not present

## 2016-11-22 MED FILL — ESTRADIOL 0.1 MG/GM CRM: 0.1 | 90 days supply | Qty: 43 | Fill #0

## 2017-04-26 ENCOUNTER — Other Ambulatory Visit: Payer: Self-pay | Admitting: Obstetrics and Gynecology

## 2017-04-26 DIAGNOSIS — Z1231 Encounter for screening mammogram for malignant neoplasm of breast: Secondary | ICD-10-CM

## 2017-05-30 ENCOUNTER — Ambulatory Visit
Admission: RE | Admit: 2017-05-30 | Discharge: 2017-05-30 | Disposition: A | Discharge status: Home / Self Care | Payer: 59 | Source: Ambulatory Visit | Attending: Obstetrics and Gynecology | Admitting: Obstetrics and Gynecology

## 2017-05-30 DIAGNOSIS — Z1231 Encounter for screening mammogram for malignant neoplasm of breast: Secondary | ICD-10-CM

## 2017-05-31 ENCOUNTER — Ambulatory Visit: Payer: 59

## 2017-06-04 ENCOUNTER — Ambulatory Visit: Payer: 59 | Admitting: Obstetrics and Gynecology

## 2017-07-02 ENCOUNTER — Ambulatory Visit: Payer: 59 | Admitting: Obstetrics and Gynecology

## 2017-07-17 ENCOUNTER — Ambulatory Visit (INDEPENDENT_AMBULATORY_CARE_PROVIDER_SITE_OTHER): Payer: 59 | Admitting: Obstetrics and Gynecology

## 2017-07-17 ENCOUNTER — Telehealth: Payer: Self-pay | Admitting: Obstetrics and Gynecology

## 2017-07-17 ENCOUNTER — Encounter: Payer: Self-pay | Admitting: Obstetrics and Gynecology

## 2017-07-17 VITALS — BP 110/78 | HR 60 | Resp 16 | Ht 66.5 in | Wt 133.0 lb

## 2017-07-17 DIAGNOSIS — N952 Postmenopausal atrophic vaginitis: Secondary | ICD-10-CM | POA: Diagnosis not present

## 2017-07-17 DIAGNOSIS — A6000 Herpesviral infection of urogenital system, unspecified: Secondary | ICD-10-CM

## 2017-07-17 DIAGNOSIS — Z01419 Encounter for gynecological examination (general) (routine) without abnormal findings: Secondary | ICD-10-CM | POA: Diagnosis not present

## 2017-07-17 MED ORDER — ESTRADIOL 0.1 MG/GM VA CREA: TOPICAL_CREAM | VAGINAL | 1 refills | Status: DC

## 2017-07-17 MED ORDER — VALACYCLOVIR HCL 500 MG PO TABS: 500.0000 mg | ORAL_TABLET | Freq: Two times a day (BID) | ORAL | 1 refills | Status: DC

## 2017-07-17 MED ORDER — ZOLPIDEM TARTRATE 10 MG PO TABS: 10.0000 mg | ORAL_TABLET | Freq: Every evening | ORAL | 1 refills | Status: DC | PRN

## 2017-07-17 MED FILL — ESTRADIOL 0.1 MG/GM CREA: 0.1 | 90 days supply | Qty: 43 | Fill #0

## 2017-07-17 MED FILL — VALACYCLOVIR HCL 500 MG TAB: 500 | 15 days supply | Qty: 30 | Fill #0

## 2017-07-17 NOTE — Telephone Encounter (Signed)
Rx printed and to [facility] for signature for faxing.

## 2017-07-17 NOTE — Telephone Encounter (Signed)
Last refill of Ambien 10 mg take 1 tablet at bedtime PRN was given 05/31/2016 #30 1RF.   Okay to refill with #30 1RF?

## 2017-07-17 NOTE — Telephone Encounter (Signed)
Yes, if she needs more than this in a year, she will need to f/u with her primary

## 2017-07-17 NOTE — Progress Notes (Signed)
61 y.o. Sandra Barnes MarriedCaucasianF here for annual exam. She has a h/o HSV, rare out breaks. Last time was a couple of months ago. She would like a script for Valtrex. She is using vaginal estrogen, no dyspareunia. No vaginal bleeding. No bowel or bladder issues.      Patient's last menstrual period was 08/26/2011.          Sexually active: Yes.    The current method of family planning is post menopausal status.    Exercising: Yes.    run, hike Smoker:  no  Health Maintenance: Pap:  05/31/16 Neg. HR HPV:neg   12/30/12 Neg  History of abnormal Pap:  Yes, age 77 MMG:  05/30/17 BIRADS1:neg  Colonoscopy:  08/13/08 f/u 10 years  BMD:   none TDaP:  05/31/16    reports that she has never smoked. She has never used smokeless tobacco. She reports that she drinks about 3.5 - 5.0 oz of alcohol per week . She reports that she does not use drugs. 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 Kids are 23 and 25. Younger son finished college and is in [place] in a temporary job, will be traveling around and hopefully will go to grad school. Older son is living at home, working as a Quarry manager. Husband is also a FP, MD  Past Medical History:  Diagnosis Date  . Elevated bilirubin August 2016  . H/O abnormal Pap smear    age 70  . Malaria 1991    Past Surgical History:  Procedure Laterality Date  . COLONOSCOPY  11/09  . 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 estradiol (ESTRACE) 0.1 MG/GM vaginal cream 1 gram vaginally every night x 1 week, then twice weekly at hs 42.5 g 1  . zolpidem (AMBIEN) 10 MG tablet Take 1 tablet (10 mg total) by mouth at bedtime as needed for sleep. 30 tablet 1   No current facility-administered medications for this visit.     Family History  Problem Relation Age of Onset  . Kidney disease Maternal Grandfather        polycystic kidneys  . Heart Problems Father      Review of Systems  Constitutional: Negative.   HENT: Negative.   Eyes: Negative.   Respiratory: Negative.   Cardiovascular: Negative.   Gastrointestinal: Negative.   Endocrine: Negative.   Genitourinary: Negative.   Musculoskeletal: Negative.   Skin: Negative.   Allergic/Immunologic: Negative.   Neurological: Negative.   Hematological: Negative.   Psychiatric/Behavioral: Negative.     Exam:   BP 110/78 (BP Location: Right Arm, Patient Position: Sitting, Cuff Size: Normal)   Pulse 60   Resp 16   Ht 5' 6.5" (1.689 m)   Wt 133 lb (60.3 kg)   LMP 08/26/2011   BMI 21.15 kg/m   Weight change: @WEIGHTCHANGE @ Height:   Height: 5' 6.5" (168.9 cm)  Ht Readings from Last 3 Encounters:  07/17/17 5' 6.5" (1.689 m)  05/31/16 5' 6.25" (1.683 m)  05/06/15 5\' 6"  (1.676 m)    General appearance: alert, cooperative and appears stated age Head: Normocephalic, without obvious abnormality, atraumatic Neck: no adenopathy, supple, symmetrical, trachea midline and thyroid normal to inspection and palpation Lungs: clear to auscultation bilaterally Cardiovascular: regular rate and rhythm Breasts: normal appearance, no masses or tenderness Abdomen: soft, non-tender; non distended,  no masses,  no  organomegaly Extremities: extremities normal, atraumatic, no cyanosis or edema Skin: Skin color, texture, turgor normal. No rashes or lesions Lymph nodes: Cervical, supraclavicular, and axillary nodes normal. No abnormal inguinal nodes palpated Neurologic: Grossly normal   Pelvic: External genitalia:  no lesions              Urethra:  normal appearing urethra with no masses, tenderness or lesions              Bartholins and Skenes: normal                 Vagina: normal appearing vagina with mild atrophy, normal color and discharge, no lesions              Cervix: no lesions               Bimanual Exam:  Uterus:  normal size, contour, position, consistency, mobility, non-tender               Adnexa: no mass, fullness, tenderness               Rectovaginal: Confirms               Anus:  normal sphincter tone, no lesions  Chaperone was present for exam.  A:  Well Woman with normal exam  Vaginal atrophy, improved with estrogen cream  Occasional HSV outbreaks   P:   No pap this year  Valtrex for prn use  Mammogram and colonoscopy UTD  Will not do screening labs this year  Discussed calcium and vit D

## 2017-07-17 NOTE — Patient Instructions (Signed)

## 2017-07-17 NOTE — Telephone Encounter (Signed)
Patient was in to see Dr Talbert Nan and did not get a prescription for Zolpidem 10mg .

## 2017-07-18 MED FILL — ZOLPIDEM TARTRATE 10 MG TAB: 10 | 30 days supply | Qty: 30 | Fill #0

## 2017-07-19 NOTE — Telephone Encounter (Signed)
Starlyn Skeans, LPN faxed rx to [facility]. Encounter closed.

## 2017-09-28 MED FILL — predniSONE 20 MG TABS: 20 | 9 days supply | Qty: 20 | Fill #0

## 2017-11-28 DIAGNOSIS — H5203 Hypermetropia, bilateral: Secondary | ICD-10-CM | POA: Diagnosis not present

## 2017-11-28 DIAGNOSIS — H524 Presbyopia: Secondary | ICD-10-CM | POA: Diagnosis not present

## 2017-11-28 DIAGNOSIS — H52223 Regular astigmatism, bilateral: Secondary | ICD-10-CM | POA: Diagnosis not present

## 2018-07-05 ENCOUNTER — Other Ambulatory Visit: Payer: Self-pay | Admitting: Obstetrics and Gynecology

## 2018-07-05 DIAGNOSIS — Z1231 Encounter for screening mammogram for malignant neoplasm of breast: Secondary | ICD-10-CM

## 2018-07-16 ENCOUNTER — Encounter: Payer: Self-pay | Admitting: Gastroenterology

## 2018-08-08 ENCOUNTER — Ambulatory Visit
Admission: RE | Admit: 2018-08-08 | Discharge: 2018-08-08 | Disposition: A | Discharge status: Home / Self Care | Payer: 59 | Source: Ambulatory Visit | Attending: Obstetrics and Gynecology | Admitting: Obstetrics and Gynecology

## 2018-08-08 DIAGNOSIS — Z1231 Encounter for screening mammogram for malignant neoplasm of breast: Secondary | ICD-10-CM

## 2018-08-14 ENCOUNTER — Other Ambulatory Visit: Payer: Self-pay

## 2018-08-14 ENCOUNTER — Ambulatory Visit (INDEPENDENT_AMBULATORY_CARE_PROVIDER_SITE_OTHER): Payer: 59 | Admitting: Obstetrics and Gynecology

## 2018-08-14 ENCOUNTER — Encounter: Payer: Self-pay | Admitting: Obstetrics and Gynecology

## 2018-08-14 VITALS — BP 108/78 | HR 56 | Ht 66.5 in | Wt 133.8 lb

## 2018-08-14 DIAGNOSIS — Z01419 Encounter for gynecological examination (general) (routine) without abnormal findings: Secondary | ICD-10-CM

## 2018-08-14 DIAGNOSIS — N952 Postmenopausal atrophic vaginitis: Secondary | ICD-10-CM

## 2018-08-14 MED ORDER — ESTRADIOL 0.1 MG/GM VA CREA: TOPICAL_CREAM | VAGINAL | 1 refills | Status: DC

## 2018-08-14 MED ORDER — ZOLPIDEM TARTRATE 10 MG PO TABS: 10.0000 mg | ORAL_TABLET | Freq: Every evening | ORAL | 1 refills | Status: DC | PRN

## 2018-08-14 MED FILL — ESTRADIOL 0.1 MG/GM CREA: 0.1 | 90 days supply | Qty: 43 | Fill #0

## 2018-08-14 NOTE — Progress Notes (Signed)
62 y.o. G35P2002 Married White or Caucasian Not Hispanic or Latino female here for annual exam.  No vaginal bleeding. She is sexually active, as long as she is using the vaginal estrogen she doesn't have pain with intercourse.  No bowel or bladder c/o.     Patient's last menstrual period was 08/26/2011.          Sexually active: Yes.    The current method of family planning is post menopausal status.    Exercising: Yes.    running, hiking Smoker:  no  Health Maintenance: Pap:  05/31/16 Neg. HR HPV:neg              12/30/12 Neg  History of abnormal Pap:  Yes, age 30 MMG:  08/08/2018 BIRADS1:neg  Colonoscopy:  08/13/08 f/u 10 years, scheduled.  BMD:   none TDaP:  05/31/16    reports that she has never smoked. She has never used smokeless tobacco. She reports that she drinks about 7.0 standard drinks of alcohol per week. She reports that she does not use drugs. 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 Kids are 24 and 26. Going to have a grandchild in 1/20 (lives here). Husband is also a FP, MD  Past Medical History:  Diagnosis Date  . Elevated bilirubin August 2016  . H/O abnormal Pap smear    age 89  . Malaria 1991    Past Surgical History:  Procedure Laterality Date  . COLONOSCOPY  11/09  . 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 estradiol (ESTRACE) 0.1 MG/GM vaginal cream 1 gram vaginally twice weekly at hs 42.5 g 1  . valACYclovir (VALTREX) 500 MG tablet Take 1 tablet (500 mg total) by mouth 2 (two) times daily. Take as directed. 30 tablet 1  . zolpidem (AMBIEN) 10 MG tablet Take 1 tablet (10 mg total) by mouth at bedtime as needed for sleep. 30 tablet 1   No current facility-administered medications for this visit.     Family History  Problem Relation Age of Onset  . Kidney disease Maternal Grandfather        polycystic kidneys  . Heart Problems Father      Review of Systems  Constitutional: Negative.   HENT: Negative.   Eyes: Negative.   Respiratory: Negative.   Cardiovascular: Negative.   Gastrointestinal: Negative.   Endocrine: Negative.   Genitourinary:       Vaginal dryness  Musculoskeletal: Negative.   Skin: Negative.   Allergic/Immunologic: Negative.   Neurological: Negative.   Hematological: Negative.   Psychiatric/Behavioral: Negative.     Exam:   BP 108/78 (BP Location: Right Arm, Patient Position: Sitting, Cuff Size: Normal)   Pulse (!) 56   Ht 5' 6.5" (1.689 m)   Wt 133 lb 12.8 oz (60.7 kg)   LMP 08/26/2011   BMI 21.27 kg/m   Weight change: @WEIGHTCHANGE @ Height:   Height: 5' 6.5" (168.9 cm)  Ht Readings from Last 3 Encounters:  08/14/18 5' 6.5" (1.689 m)  07/17/17 5' 6.5" (1.689 m)  05/31/16 5' 6.25" (1.683 m)    General appearance: alert, cooperative and appears stated age Head: Normocephalic, without obvious abnormality, atraumatic Neck: no adenopathy, supple, symmetrical, trachea midline and thyroid normal to inspection and palpation Lungs: clear to auscultation bilaterally Cardiovascular: regular rate and rhythm Breasts: normal appearance, no masses or tenderness Abdomen: soft,  non-tender; non distended,  no masses,  no organomegaly Extremities: extremities normal, atraumatic, no cyanosis or edema Skin: Skin color, texture, turgor normal. No rashes or lesions Lymph nodes: Cervical, supraclavicular, and axillary nodes normal. No abnormal inguinal nodes palpated Neurologic: Grossly normal   Pelvic: External genitalia:  no lesions              Urethra:  normal appearing urethra with no masses, tenderness or lesions              Bartholins and Skenes: normal                 Vagina: atrophic appearing vagina with normal color and discharge, no lesions              Cervix: no lesions               Bimanual Exam:  Uterus:  normal size, contour, position, consistency, mobility, non-tender               Adnexa: no mass, fullness, tenderness               Rectovaginal: Confirms               Anus:  normal sphincter tone, no lesions  Chaperone was present for exam.  A:  Well Woman with normal exam  Vaginal atrophy  P:   No pap this year  Discussed breast self exam  Discussed calcium and vit D intake  Mammogram just done  Colonoscopy scheduled   Continue vaginal estrogen  Valtrex for prn use

## 2018-08-15 ENCOUNTER — Telehealth: Payer: Self-pay | Admitting: Obstetrics and Gynecology

## 2018-08-15 MED FILL — ZOLPIDEM TARTRATE 10 MG TAB: 10 | 30 days supply | Qty: 30 | Fill #0

## 2018-08-15 NOTE — Telephone Encounter (Signed)
Rx was faxed to [facility].  Contacted patient detailed message left, okay per designated party release form that Rx was faxed to [facility] and processed.  Any questions, please call back.

## 2018-08-15 NOTE — Telephone Encounter (Signed)
Patient stated that she was to get a printed prescription while at her visit yesterday for Zolpidem 10 MG. Patient did not receive prescription and is wondering if it can be called into the pharmacy.

## 2018-08-19 ENCOUNTER — Encounter: Payer: Self-pay | Admitting: Gastroenterology

## 2018-08-19 ENCOUNTER — Ambulatory Visit (AMBULATORY_SURGERY_CENTER): Payer: Self-pay | Admitting: *Deleted

## 2018-08-19 VITALS — Ht 66.5 in | Wt 137.0 lb

## 2018-08-19 DIAGNOSIS — Z1211 Encounter for screening for malignant neoplasm of colon: Secondary | ICD-10-CM

## 2018-08-19 MED ORDER — NA SULFATE-K SULFATE-MG SULF 17.5-3.13-1.6 GM/177ML PO SOLN: ORAL | 0 refills | Status: DC

## 2018-08-19 MED FILL — SUPREP BOWEL PREP KIT: 17.5-3.13-1 | 1 days supply | Qty: 354 | Fill #0

## 2018-08-19 NOTE — Progress Notes (Signed)
Patient denies any allergies to eggs or soy. Patient denies any problems with anesthesia/sedation. Patient denies any oxygen use at home. Patient denies taking any diet/weight loss medications or blood thinners. EMMI education assisgned to patient on colonoscopy, this was explained and instructions given to patient. 

## 2018-09-02 ENCOUNTER — Ambulatory Visit (AMBULATORY_SURGERY_CENTER): Payer: 59 | Admitting: Gastroenterology

## 2018-09-02 ENCOUNTER — Encounter: Payer: Self-pay | Admitting: Gastroenterology

## 2018-09-02 VITALS — BP 113/65 | HR 45 | Temp 96.9°F | Resp 14 | Ht 66.5 in | Wt 133.0 lb

## 2018-09-02 DIAGNOSIS — D120 Benign neoplasm of cecum: Secondary | ICD-10-CM | POA: Diagnosis not present

## 2018-09-02 DIAGNOSIS — D123 Benign neoplasm of transverse colon: Secondary | ICD-10-CM

## 2018-09-02 DIAGNOSIS — D125 Benign neoplasm of sigmoid colon: Secondary | ICD-10-CM

## 2018-09-02 DIAGNOSIS — D122 Benign neoplasm of ascending colon: Secondary | ICD-10-CM | POA: Diagnosis not present

## 2018-09-02 DIAGNOSIS — Z1211 Encounter for screening for malignant neoplasm of colon: Secondary | ICD-10-CM

## 2018-09-02 DIAGNOSIS — K635 Polyp of colon: Secondary | ICD-10-CM

## 2018-09-02 MED ORDER — SODIUM CHLORIDE 0.9 % IV SOLN: 500.0000 mL | Freq: Once | INTRAVENOUS | Status: DC

## 2018-09-02 NOTE — Op Note (Signed)
[facility] Patient Name: Sandra Barnes Procedure Date: 09/02/2018 8:34 AM MRN: [id number] Endoscopist: Mauri Pole , MD Age: 62 Referring MD:  Date of Birth: Sep 06, 1956 Gender: Female Account #: [id number] Procedure:                Colonoscopy Indications:              Screening for colorectal malignant neoplasm, Last                            colonoscopy: 2009 Medicines:                Monitored Anesthesia Care Procedure:                Pre-Anesthesia Assessment:                           - Prior to the procedure, a History and Physical                            was performed, and patient medications and                            allergies were reviewed. The patient's tolerance of                            previous anesthesia was also reviewed. The risks                            and benefits of the procedure and the sedation                            options and risks were discussed with the patient.                            All questions were answered, and informed consent                            was obtained. Prior Anticoagulants: The patient has                            taken no previous anticoagulant or antiplatelet                            agents. ASA Grade Assessment: II - A patient with                            mild systemic disease. After reviewing the risks                            and benefits, the patient was deemed in                            satisfactory condition to undergo the procedure.  After obtaining informed consent, the colonoscope                            was passed under direct vision. Throughout the                            procedure, the patient's blood pressure, pulse, and                            oxygen saturations were monitored continuously. The                            Colonoscope was introduced through the anus and                            advanced to the the cecum,  identified by                            appendiceal orifice and ileocecal valve. The                            colonoscopy was performed without difficulty. The                            patient tolerated the procedure well. The quality                            of the bowel preparation was fair after extensive                            lavage and suction. The ileocecal valve,                            appendiceal orifice, and rectum were photographed. Scope In: 8:41:12 AM Scope Out: 9:07:52 AM Scope Withdrawal Time: 0 hours 15 minutes 35 seconds  Total Procedure Duration: 0 hours 26 minutes 40 seconds  Findings:                 The perianal and digital rectal examinations were                            normal.                           Two sessile polyps were found in the transverse                            colon and ascending colon. The polyps were 5 to 11                            mm in size. These polyps were removed with a cold                            snare. Resection and retrieval were complete.  A 1 mm polyp was found in the cecum. The polyp was                            sessile. The polyp was removed with a cold biopsy                            forceps. Resection and retrieval were complete.                           A 16 mm polyp was found in the sigmoid colon. The                            polyp was pedunculated. The polyp was removed with                            a hot snare. Resection and retrieval were complete.                           Non-bleeding internal hemorrhoids were found during                            retroflexion. The hemorrhoids were small. Complications:            No immediate complications. Estimated Blood Loss:     Estimated blood loss was minimal. Impression:               - Preparation of the colon was fair.                           - Two 5 to 11 mm polyps in the transverse colon and                             in the ascending colon, removed with a cold snare.                            Resected and retrieved.                           - One 1 mm polyp in the cecum, removed with a cold                            biopsy forceps. Resected and retrieved.                           - One 16 mm polyp in the sigmoid colon, removed                            with a hot snare. Resected and retrieved.                           - Non-bleeding internal hemorrhoids. Recommendation:           - Patient has a contact number available for  emergencies. The signs and symptoms of potential                            delayed complications were discussed with the                            patient. Return to normal activities tomorrow.                            Written discharge instructions were provided to the                            patient.                           - Resume previous diet.                           - Continue present medications.                           - Await pathology results.                           - Repeat colonoscopy in 1 year because the bowel                            preparation was suboptimal.                           - For future colonoscopy the patient will require                            an extended preparation. If there are any                            questions, please contact the gastroenterologist. Mauri Pole, MD 09/02/2018 9:13:36 AM This report has been signed electronically.

## 2018-09-02 NOTE — Progress Notes (Signed)
PT taken to PACU. Monitors in place. VSS. Report given to RN. 

## 2018-09-02 NOTE — Progress Notes (Signed)
Called to room to assist during endoscopic procedure.  Patient ID and intended procedure confirmed with present staff. Received instructions for my participation in the procedure from the performing physician.  

## 2018-09-02 NOTE — Patient Instructions (Signed)
  INFORMATION ON POLYPS AND HEMORRHOIDS GIVEN TO YOU TODAY   AWAIT PATHOLOGY RESULTS ON POLYP REMOVED    REPEAT COLONOSCOPY IN 1 YEAR DUE TO INADEQUATE PREP   YOU HAD AN ENDOSCOPIC PROCEDURE TODAY AT [facility]:   Refer to the procedure report that was given to you for any specific questions about what was found during the examination.  If the procedure report does not answer your questions, please call your gastroenterologist to clarify.  If you requested that your care partner not be given the details of your procedure findings, then the procedure report has been included in a sealed envelope for you to review at your convenience later.  YOU SHOULD EXPECT: Some feelings of bloating in the abdomen. Passage of more gas than usual.  Walking can help get rid of the air that was put into your GI tract during the procedure and reduce the bloating. If you had a lower endoscopy (such as a colonoscopy or flexible sigmoidoscopy) you may notice spotting of blood in your stool or on the toilet paper. If you underwent a bowel prep for your procedure, you may not have a normal bowel movement for a few days.  Please Note:  You might notice some irritation and congestion in your nose or some drainage.  This is from the oxygen used during your procedure.  There is no need for concern and it should clear up in a day or so.  SYMPTOMS TO REPORT IMMEDIATELY:   Following lower endoscopy (colonoscopy or flexible sigmoidoscopy):  Excessive amounts of blood in the stool  Significant tenderness or worsening of abdominal pains  Swelling of the abdomen that is new, acute  Fever of 100F or higher    For urgent or emergent issues, a gastroenterologist can be reached at any hour by calling [phone].   DIET:  We do recommend a small meal at first, but then you may proceed to your regular diet.  Drink plenty of fluids but you should avoid alcoholic beverages for 24 hours.  ACTIVITY:  You  should plan to take it easy for the rest of today and you should NOT DRIVE or use heavy machinery until tomorrow (because of the sedation medicines used during the test).    FOLLOW UP: Our staff will call the number listed on your records the next business day following your procedure to check on you and address any questions or concerns that you may have regarding the information given to you following your procedure. If we do not reach you, we will leave a message.  However, if you are feeling well and you are not experiencing any problems, there is no need to return our call.  We will assume that you have returned to your regular daily activities without incident.  If any biopsies were taken you will be contacted by phone or by letter within the next 1-3 weeks.  Please call us at [phone] if you have not heard about the biopsies in 3 weeks.    SIGNATURES/CONFIDENTIALITY: You and/or your care partner have signed paperwork which will be entered into your electronic medical record.  These signatures attest to the fact that that the information above on your After Visit Summary has been reviewed and is understood.  Full responsibility of the confidentiality of this discharge information lies with you and/or your care-partner.

## 2018-09-03 ENCOUNTER — Telehealth: Payer: Self-pay

## 2018-09-03 NOTE — Telephone Encounter (Signed)
  Follow up Call-  Call back number 09/02/2018  Post procedure Call Back phone  # [phone]  Permission to leave phone message Yes  Some recent data might be hidden     Patient questions:  Do you have a fever, pain , or abdominal swelling? No. Pain Score  0 *  Have you tolerated food without any problems? Yes.    Have you been able to return to your normal activities? Yes.    Do you have any questions about your discharge instructions: Diet   No. Medications  No. Follow up visit  No.  Do you have questions or concerns about your Care? No.  Actions: * If pain score is 4 or above: No action needed, pain <4.

## 2018-09-09 ENCOUNTER — Encounter: Payer: Self-pay | Admitting: Gastroenterology

## 2019-01-13 DIAGNOSIS — Z23 Encounter for immunization: Secondary | ICD-10-CM | POA: Diagnosis not present

## 2019-07-02 DIAGNOSIS — Z23 Encounter for immunization: Secondary | ICD-10-CM | POA: Diagnosis not present

## 2019-07-23 ENCOUNTER — Other Ambulatory Visit: Payer: Self-pay | Admitting: Obstetrics and Gynecology

## 2019-07-23 DIAGNOSIS — Z1231 Encounter for screening mammogram for malignant neoplasm of breast: Secondary | ICD-10-CM

## 2019-08-25 ENCOUNTER — Encounter: Payer: Self-pay | Admitting: Gastroenterology

## 2019-08-26 NOTE — Progress Notes (Signed)
63 y.o. G84P2002 Married White or Caucasian Not Hispanic or Latino female here for annual exam.  No vaginal bleeding. Using the vaginal estrogen, minimal pain as long as she uses it.      Her brother died in 03-13-2023. Mom lives local, has lung cancer.   Patient's last menstrual period was 08/26/2011.          Sexually active: Yes.    The current method of family planning is post menopausal status.    Exercising: Yes.    hiking, running Smoker:  no  Health Maintenance: Pap:05/31/16 Neg. HR HPV:neg  12/30/12 Neg  History of abnormal Pap:Yes, age 43 MMG:08/08/2018 BIRADS1:neg, 09/10/2019 scheduled Colonoscopy:09/02/2018 Polyps, needs repeat, due this month.  [id number] TDaP:05/31/16   reports that she has never smoked. She has never used smokeless tobacco. She reports current alcohol use of about 7.0 standard drinks of alcohol per week. She reports that she does not use drugs. 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 She is going to start working again. 2 kids, one grandchild.  Husband is also a FP, MD  Past Medical History:  Diagnosis Date  . Elevated bilirubin August 2016  . H/O abnormal Pap smear    age 63  . Malaria 1991    Past Surgical History:  Procedure Laterality Date  . COLONOSCOPY  11/09   Dr.Brodie   . 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 estradiol (ESTRACE) 0.1 MG/GM vaginal cream 1 gram vaginally twice weekly at hs 42.5 g 1  . valACYclovir (VALTREX) 500 MG tablet Take 1 tablet (500 mg total) by mouth 2 (two) times daily. Take as directed. 30 tablet 1  . zolpidem (AMBIEN) 10 MG tablet Take 1 tablet (10 mg total) by mouth at bedtime as needed for sleep. 30 tablet 1   No current facility-administered medications for this visit.     Family History  Problem Relation Age of Onset  . Kidney disease  Maternal Grandfather        polycystic kidneys  . Lung cancer Mother   . Heart Problems Father   . Colon cancer Neg Hx   . Colon polyps Neg Hx   . Esophageal cancer Neg Hx   . Rectal cancer Neg Hx   . Stomach cancer Neg Hx     Review of Systems  Constitutional: Negative.   HENT: Negative.   Eyes: Negative.   Respiratory: Negative.   Cardiovascular: Negative.   Gastrointestinal: Negative.   Endocrine: Negative.   Genitourinary: Negative.   Musculoskeletal: Negative.   Skin: Negative.   Allergic/Immunologic: Negative.   Neurological: Negative.   Hematological: Negative.   Psychiatric/Behavioral: Negative.     Exam:   BP 116/80 (BP Location: Right Arm, Patient Position: Sitting, Cuff Size: Normal)   Pulse 60   Temp (!) 96.7 F (35.9 C) (Skin)   Ht 5\' 6"  (1.676 m)   Wt 136 lb 6.4 oz (61.9 kg)   LMP 08/26/2011   BMI 22.02 kg/m   Weight change: @WEIGHTCHANGE @ Height:   Height: 5\' 6"  (167.6 cm)  Ht Readings from Last 3 Encounters:  08/27/19 5\' 6"  (1.676 m)  09/02/18 5' 6.5" (1.689 m)  08/19/18 5' 6.5" (1.689 m)    General appearance: alert, cooperative and appears stated age Head: Normocephalic, without obvious abnormality, atraumatic Neck: no adenopathy, supple,  symmetrical, trachea midline and thyroid normal to inspection and palpation Lungs: clear to auscultation bilaterally Cardiovascular: regular rate and rhythm Breasts: normal appearance, no masses or tenderness Abdomen: soft, non-tender; non distended,  no masses,  no organomegaly Extremities: extremities normal, atraumatic, no cyanosis or edema Skin: Skin color, texture, turgor normal. No rashes or lesions Lymph nodes: Cervical, supraclavicular, and axillary nodes normal. No abnormal inguinal nodes palpated Neurologic: Grossly normal   Pelvic: External genitalia:  no lesions              Urethra:  normal appearing urethra with no masses, tenderness or lesions              Bartholins and Skenes: normal                  Vagina: atrophic appearing vagina with normal color and discharge, no lesions              Cervix: no lesions               Bimanual Exam:  Uterus:  normal size, contour, position, consistency, mobility, non-tender              Adnexa: no mass, fullness, tenderness               Rectovaginal: Confirms               Anus:  normal sphincter tone, no lesions  Chaperone was present for exam.  A:  Well Woman with normal exam  P:   No pap this year  Discussed breast self exam  Discussed calcium and vit D intake  Screening labs

## 2019-08-27 ENCOUNTER — Other Ambulatory Visit: Payer: Self-pay

## 2019-08-27 ENCOUNTER — Encounter: Payer: Self-pay | Admitting: Obstetrics and Gynecology

## 2019-08-27 ENCOUNTER — Ambulatory Visit (INDEPENDENT_AMBULATORY_CARE_PROVIDER_SITE_OTHER): Payer: 59 | Admitting: Obstetrics and Gynecology

## 2019-08-27 VITALS — BP 116/80 | HR 60 | Temp 96.7°F | Ht 66.0 in | Wt 136.4 lb

## 2019-08-27 DIAGNOSIS — Z0000 Encounter for general adult medical examination without abnormal findings: Secondary | ICD-10-CM

## 2019-08-27 DIAGNOSIS — Z01419 Encounter for gynecological examination (general) (routine) without abnormal findings: Secondary | ICD-10-CM | POA: Diagnosis not present

## 2019-08-27 MED ORDER — ZOLPIDEM TARTRATE 10 MG PO TABS: 10.0000 mg | ORAL_TABLET | Freq: Every evening | ORAL | 1 refills | Status: DC | PRN

## 2019-08-27 MED ORDER — VALACYCLOVIR HCL 500 MG PO TABS: 500.0000 mg | ORAL_TABLET | Freq: Two times a day (BID) | ORAL | 1 refills | Status: DC

## 2019-08-27 MED ORDER — ESTRADIOL 0.1 MG/GM VA CREA: TOPICAL_CREAM | VAGINAL | 1 refills | Status: DC

## 2019-08-27 MED FILL — ZOLPIDEM TARTRATE 10 MG TAB: 10 | 30 days supply | Qty: 30 | Fill #0

## 2019-08-27 MED FILL — VALACYCLOVIR HCL 500 MG TAB: 500 | 15 days supply | Qty: 30 | Fill #0

## 2019-08-27 NOTE — Patient Instructions (Signed)

## 2019-08-28 LAB — CBC
Hematocrit: 40.9 % (ref 34.0–46.6)
Hemoglobin: 13.9 g/dL (ref 11.1–15.9)
MCH: 31.2 pg (ref 26.6–33.0)
MCHC: 34 g/dL (ref 31.5–35.7)
MCV: 92 fL (ref 79–97)
Platelets: 219 10*3/uL (ref 150–450)
RBC: 4.46 x10E6/uL (ref 3.77–5.28)
RDW: 11.9 % (ref 11.7–15.4)
WBC: 5.6 10*3/uL (ref 3.4–10.8)

## 2019-08-28 LAB — COMPREHENSIVE METABOLIC PANEL
ALT: 13 IU/L (ref 0–32)
AST: 23 IU/L (ref 0–40)
Albumin/Globulin Ratio: 2.8 — ABNORMAL HIGH (ref 1.2–2.2)
Albumin: 5 g/dL — ABNORMAL HIGH (ref 3.8–4.8)
Alkaline Phosphatase: 58 IU/L (ref 39–117)
BUN/Creatinine Ratio: 20 (ref 12–28)
BUN: 16 mg/dL (ref 8–27)
Bilirubin Total: 1.6 mg/dL — ABNORMAL HIGH (ref 0.0–1.2)
CO2: 25 mmol/L (ref 20–29)
Calcium: 9.2 mg/dL (ref 8.7–10.3)
Chloride: 102 mmol/L (ref 96–106)
Creatinine, Ser: 0.81 mg/dL (ref 0.57–1.00)
GFR calc Af Amer: 91 mL/min/{1.73_m2} (ref >59)
GFR calc non Af Amer: 79 mL/min/{1.73_m2} (ref >59)
Globulin, Total: 1.8 g/dL (ref 1.5–4.5)
Glucose: 78 mg/dL (ref 65–99)
Potassium: 4.2 mmol/L (ref 3.5–5.2)
Sodium: 140 mmol/L (ref 134–144)
Total Protein: 6.8 g/dL (ref 6.0–8.5)

## 2019-08-28 LAB — LIPID PANEL
Chol/HDL Ratio: 2.1 ratio (ref 0.0–4.4)
Cholesterol, Total: 207 mg/dL — ABNORMAL HIGH (ref 100–199)
HDL: 98 mg/dL (ref >39)
LDL Chol Calc (NIH): 97 mg/dL (ref 0–99)
Triglycerides: 65 mg/dL (ref 0–149)
VLDL Cholesterol Cal: 12 mg/dL (ref 5–40)

## 2019-09-10 ENCOUNTER — Other Ambulatory Visit: Payer: Self-pay

## 2019-09-10 ENCOUNTER — Ambulatory Visit
Admission: RE | Admit: 2019-09-10 | Discharge: 2019-09-10 | Disposition: A | Discharge status: Home / Self Care | Payer: 59 | Source: Ambulatory Visit | Attending: Obstetrics and Gynecology | Admitting: Obstetrics and Gynecology

## 2019-09-10 DIAGNOSIS — Z1231 Encounter for screening mammogram for malignant neoplasm of breast: Secondary | ICD-10-CM

## 2019-10-20 ENCOUNTER — Other Ambulatory Visit: Payer: Self-pay

## 2019-10-21 ENCOUNTER — Ambulatory Visit (INDEPENDENT_AMBULATORY_CARE_PROVIDER_SITE_OTHER): Payer: No Typology Code available for payment source | Admitting: Internal Medicine

## 2019-10-21 ENCOUNTER — Encounter: Payer: Self-pay | Admitting: Internal Medicine

## 2019-10-21 VITALS — BP 140/80 | HR 60 | Temp 97.5°F | Ht 66.0 in | Wt 138.1 lb

## 2019-10-21 DIAGNOSIS — R030 Elevated blood-pressure reading, without diagnosis of hypertension: Secondary | ICD-10-CM

## 2019-10-21 DIAGNOSIS — Z1211 Encounter for screening for malignant neoplasm of colon: Secondary | ICD-10-CM | POA: Diagnosis not present

## 2019-10-21 NOTE — Patient Instructions (Signed)
-  Nice seeing you today!!  -Check BP at home and send me a MyChart message once you have collected 2-3 weeks worth of measurements.  -Referral to GI today.  -Remember to look into the shingles vaccination series.  -Schedule follow up in 1 year or sooner as needed.

## 2019-10-21 NOTE — Progress Notes (Signed)
New Patient Office Visit     This visit occurred during the SARS-CoV-2 public health emergency.  Safety protocols were in place, including screening questions prior to the visit, additional usage of staff PPE, and extensive cleaning of exam room while observing appropriate contact time as indicated for disinfecting solutions.    CC/Reason for Visit: Establish care, GI referral Previous PCP: GYN, Dr. Talbert Nan Last Visit: December 2020  HPI: Sandra Barnes is a 64 y.o. female who is coming in today for the above mentioned reasons. Past Medical History is significant for: Insomnia for which she uses as needed Ambien prescribed by GYN and genital herpes for which she takes Valtrex when needed.  She needs to establish care, also needs referral to GI.  She had a colonoscopy 1 year ago with polypectomy and was asked to have a repeat colonoscopy in 1 year.  She has no allergies, no prior surgeries, she does not smoke, she drinks 1 to 2 glasses of red wine or beer a day, her mom was recently diagnosed with stage IV non-small cell lung cancer, her maternal grandfather had polycystic kidney disease.  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  She is an avid runner.   Past Medical/Surgical History: Past Medical History:  Diagnosis Date  . Elevated bilirubin August 2016  . H/O abnormal Pap smear    age 4  . Malaria 1991    Past Surgical History:  Procedure Laterality Date  . COLONOSCOPY  11/09   Dr.Brodie   . 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 She has never used smokeless tobacco. She reports current alcohol use of about 7.0 standard drinks of alcohol per week. She reports that she does not use drugs.  Allergies: No Known Allergies  Family History:  Family History  Problem Relation Age of Onset  . Kidney disease Maternal Grandfather        polycystic kidneys  . Lung cancer Mother   . Heart Problems Father   . Colon cancer Neg  Hx   . Colon polyps Neg Hx   . Esophageal cancer Neg Hx   . Rectal cancer Neg Hx   . Stomach cancer Neg Hx      Current Outpatient Medications:  .  estradiol (ESTRACE) 0.1 MG/GM vaginal cream, 1 gram vaginally twice weekly at hs, Disp: 42.5 g, Rfl: 1 .  valACYclovir (VALTREX) 500 MG tablet, Take 1 tablet (500 mg total) by mouth 2 (two) times daily. Take as directed., Disp: 30 tablet, Rfl: 1 .  zolpidem (AMBIEN) 10 MG tablet, Take 1 tablet (10 mg total) by mouth at bedtime as needed for sleep., Disp: 30 tablet, Rfl: 1  Review of Systems:  Constitutional: Denies fever, chills, diaphoresis, appetite change and fatigue.  HEENT: Denies photophobia, eye pain, redness, hearing loss, ear pain, congestion, sore throat, rhinorrhea, sneezing, mouth sores, trouble swallowing, neck pain, neck stiffness and tinnitus.   Respiratory: Denies SOB, DOE, cough, chest tightness,  and wheezing.   Cardiovascular: Denies chest pain, palpitations and leg swelling.  Gastrointestinal: Denies nausea, vomiting, abdominal pain, diarrhea, constipation, blood in stool and abdominal distention.  Genitourinary: Denies dysuria, urgency, frequency, hematuria, flank pain and difficulty urinating.  Endocrine: Denies: hot or cold intolerance, sweats, changes in hair or nails, polyuria, polydipsia. Musculoskeletal: Denies myalgias, back pain, joint swelling, arthralgias and gait problem.  Skin: Denies pallor, rash and wound.  Neurological: Denies dizziness, seizures, syncope, weakness, light-headedness, numbness  and headaches.  Hematological: Denies adenopathy. Easy bruising, personal or family bleeding history  Psychiatric/Behavioral: Denies suicidal ideation, mood changes, confusion, nervousness, sleep disturbance and agitation    Physical Exam: Vitals:   10/21/19 1101  BP: 140/80  Pulse: 60  Temp: (!) 97.5 F (36.4 C)  TempSrc: Temporal  SpO2: 99%  Weight: 138 lb 1.6 oz (62.6 kg)  Height: 5\' 6"  (1.676 m)   Body  mass index is 22.29 kg/m.  Constitutional: NAD, calm, comfortable Eyes: PERRL, lids and conjunctivae normal, wears corrective lenses ENMT: Mucous membranes are moist. Respiratory: clear to auscultation bilaterally, no wheezing, no crackles. Normal respiratory effort. No accessory muscle use.  Cardiovascular: Regular rate and rhythm, no murmurs / rubs / gallops. No extremity edema.  Neurologic: Grossly intact and nonfocal Psychiatric: Normal judgment and insight. Alert and oriented x 3. Normal mood.    Impression and Plan:  Screening for malignant neoplasm of colon  - Plan: Ambulatory referral to Gastroenterology  Elevated BP without diagnosis of hypertension -Blood pressure noted to be elevated to 140/80 on 2 separate measurements today. -She is not known to be hypertensive. -She will do ambulatory blood pressure monitoring and report back to me with measurements.     Patient Instructions  -Nice seeing you today!!  -Check BP at home and send me a MyChart message once you have collected 2-3 weeks worth of measurements.  -Referral to GI today.  -Remember to look into the shingles vaccination series.  -Schedule follow up in 1 year or sooner as needed.     Lelon Frohlich, MD [facility]

## 2019-10-23 ENCOUNTER — Encounter: Payer: Self-pay | Admitting: Gastroenterology

## 2019-11-06 ENCOUNTER — Ambulatory Visit: Payer: 59 | Admitting: Internal Medicine

## 2019-12-03 ENCOUNTER — Ambulatory Visit (AMBULATORY_SURGERY_CENTER): Payer: Self-pay

## 2019-12-03 ENCOUNTER — Other Ambulatory Visit: Payer: Self-pay

## 2019-12-03 VITALS — Temp 96.2°F | Ht 66.0 in | Wt 137.0 lb

## 2019-12-03 DIAGNOSIS — Z86010 Personal history of colonic polyps: Secondary | ICD-10-CM

## 2019-12-03 MED ORDER — NA SULFATE-K SULFATE-MG SULF 17.5-3.13-1.6 GM/177ML PO SOLN: 1.0000 | Freq: Once | ORAL | 0 refills | Status: AC

## 2019-12-03 MED FILL — SUPREP BOWEL PREP KIT: 17.5-3.13-1 | 1 days supply | Qty: 354 | Fill #0

## 2019-12-03 NOTE — Progress Notes (Signed)
No egg or soy allergy known to patient  No issues with past sedation with any surgeries  or procedures, no intubation problems  No diet pills per patient No home 02 use per patient  No blood thinners per patient  Pt denies issues with constipation  No A fib or A flutter  EMMI video sent to pt's e mail   Pt aware of care partner requirements and Covid-19 Safety Regulations

## 2019-12-09 ENCOUNTER — Encounter: Payer: Self-pay | Admitting: Gastroenterology

## 2019-12-17 ENCOUNTER — Other Ambulatory Visit: Payer: Self-pay

## 2019-12-17 ENCOUNTER — Encounter: Payer: Self-pay | Admitting: Gastroenterology

## 2019-12-17 ENCOUNTER — Ambulatory Visit (AMBULATORY_SURGERY_CENTER): Payer: No Typology Code available for payment source | Admitting: Gastroenterology

## 2019-12-17 VITALS — BP 103/64 | HR 55 | Temp 96.2°F | Resp 19 | Ht 66.0 in | Wt 137.0 lb

## 2019-12-17 DIAGNOSIS — D120 Benign neoplasm of cecum: Secondary | ICD-10-CM

## 2019-12-17 DIAGNOSIS — Z86010 Personal history of colonic polyps: Secondary | ICD-10-CM

## 2019-12-17 MED ORDER — SODIUM CHLORIDE 0.9 % IV SOLN: 500.0000 mL | Freq: Once | INTRAVENOUS | Status: DC

## 2019-12-17 NOTE — Op Note (Signed)
[facility] Patient Name: Sandra Barnes Procedure Date: 12/17/2019 7:59 AM MRN: [id number] Endoscopist: Mauri Pole , MD Age: 64 Referring MD:  Date of Birth: Aug 27, 1956 Gender: Female Account #: [id number] Procedure:                Colonoscopy Indications:              High risk colon cancer surveillance: Personal                            history of colonic polyps, High risk colon cancer                            surveillance: Personal history of adenoma (10 mm or                            greater in size), High risk colon cancer                            surveillance: Personal history of multiple (3 or                            more) adenomas Medicines:                Monitored Anesthesia Care Procedure:                Pre-Anesthesia Assessment:                           - Prior to the procedure, a History and Physical                            was performed, and patient medications and                            allergies were reviewed. The patient's tolerance of                            previous anesthesia was also reviewed. The risks                            and benefits of the procedure and the sedation                            options and risks were discussed with the patient.                            All questions were answered, and informed consent                            was obtained. Prior Anticoagulants: The patient has                            taken no previous anticoagulant or antiplatelet  agents. ASA Grade Assessment: II - A patient with                            mild systemic disease. After reviewing the risks                            and benefits, the patient was deemed in                            satisfactory condition to undergo the procedure.                           After obtaining informed consent, the colonoscope                            was passed under direct vision. Throughout the                            procedure, the patient's blood pressure, pulse, and                            oxygen saturations were monitored continuously. The                            Colonoscope was introduced through the anus and                            advanced to the the cecum, identified by                            appendiceal orifice and ileocecal valve. The                            colonoscopy was performed without difficulty. The                            patient tolerated the procedure well. The quality                            of the bowel preparation was excellent. The                            ileocecal valve, appendiceal orifice, and rectum                            were photographed. Scope In: 8:12:58 AM Scope Out: 8:31:33 AM Scope Withdrawal Time: 0 hours 12 minutes 6 seconds  Total Procedure Duration: 0 hours 18 minutes 35 seconds  Findings:                 The perianal and digital rectal examinations were                            normal.  A less than 1 mm polyp was found in the ileocecal                            valve. The polyp was sessile. The polyp was removed                            with a cold biopsy forceps. Resection and retrieval                            were complete.                           Scattered small-mouthed diverticula were found in                            the sigmoid colon, descending colon and ascending                            colon.                           Non-bleeding internal hemorrhoids were found during                            retroflexion. The hemorrhoids were small. Complications:            No immediate complications. Estimated Blood Loss:     Estimated blood loss was minimal. Impression:               - One less than 1 mm polyp at the ileocecal valve,                            removed with a cold biopsy forceps. Resected and                            retrieved.                            - Diverticulosis in the sigmoid colon, in the                            descending colon and in the ascending colon.                           - Non-bleeding internal hemorrhoids. Recommendation:           - Patient has a contact number available for                            emergencies. The signs and symptoms of potential                            delayed complications were discussed with the                            patient. Return to  normal activities tomorrow.                            Written discharge instructions were provided to the                            patient.                           - Resume previous diet.                           - Continue present medications.                           - Await pathology results.                           - Repeat colonoscopy in 5 years for surveillance                            based on pathology results. Mauri Pole, MD 12/17/2019 8:41:28 AM This report has been signed electronically.

## 2019-12-17 NOTE — Progress Notes (Signed)
To PACU, VSS. Report to [email or web address] 

## 2019-12-17 NOTE — Patient Instructions (Signed)
 Handouts on polyps ,diverticulosis,& hemorrhoids given to you today  Await pathology results on polyp removed     YOU HAD AN ENDOSCOPIC PROCEDURE TODAY AT THE [facility]:   Refer to the procedure report that was given to you for any specific questions about what was found during the examination.  If the procedure report does not answer your questions, please call your gastroenterologist to clarify.  If you requested that your care partner not be given the details of your procedure findings, then the procedure report has been included in a sealed envelope for you to review at your convenience later.  YOU SHOULD EXPECT: Some feelings of bloating in the abdomen. Passage of more gas than usual.  Walking can help get rid of the air that was put into your GI tract during the procedure and reduce the bloating. If you had a lower endoscopy (such as a colonoscopy or flexible sigmoidoscopy) you may notice spotting of blood in your stool or on the toilet paper. If you underwent a bowel prep for your procedure, you may not have a normal bowel movement for a few days.  Please Note:  You might notice some irritation and congestion in your nose or some drainage.  This is from the oxygen used during your procedure.  There is no need for concern and it should clear up in a day or so.  SYMPTOMS TO REPORT IMMEDIATELY:  Following lower endoscopy (colonoscopy or flexible sigmoidoscopy):  Excessive amounts of blood in the stool  Significant tenderness or worsening of abdominal pains  Swelling of the abdomen that is new, acute  Fever of 100F or higher   For urgent or emergent issues, a gastroenterologist can be reached at any hour by calling [phone]. Do not use MyChart messaging for urgent concerns.    DIET:  We do recommend a small meal at first, but then you may proceed to your regular diet.  Drink plenty of fluids but you should avoid alcoholic beverages for 24 hours.  ACTIVITY:  You  should plan to take it easy for the rest of today and you should NOT DRIVE or use heavy machinery until tomorrow (because of the sedation medicines used during the test).    FOLLOW UP: Our staff will call the number listed on your records 48-72 hours following your procedure to check on you and address any questions or concerns that you may have regarding the information given to you following your procedure. If we do not reach you, we will leave a message.  We will attempt to reach you two times.  During this call, we will ask if you have developed any symptoms of COVID 19. If you develop any symptoms (ie: fever, flu-like symptoms, shortness of breath, cough etc.) before then, please call [phone].  If you test positive for Covid 19 in the 2 weeks post procedure, please call and report this information to us.    If any biopsies were taken you will be contacted by phone or by letter within the next 1-3 weeks.  Please call us at [phone] if you have not heard about the biopsies in 3 weeks.    SIGNATURES/CONFIDENTIALITY: You and/or your care partner have signed paperwork which will be entered into your electronic medical record.  These signatures attest to the fact that that the information above on your After Visit Summary has been reviewed and is understood.  Full responsibility of the confidentiality of this discharge information lies with you and/or   you and/or your care-partner.

## 2019-12-17 NOTE — Progress Notes (Signed)
Pt's states no medical or surgical changes since previsit or office visit.   AG IV, CW vitals and LC temp.

## 2019-12-17 NOTE — Progress Notes (Signed)
Called to room to assist during endoscopic procedure.  Patient ID and intended procedure confirmed with present staff. Received instructions for my participation in the procedure from the performing physician.  

## 2019-12-19 ENCOUNTER — Telehealth: Payer: Self-pay | Admitting: *Deleted

## 2019-12-19 NOTE — Telephone Encounter (Signed)
1. Have you developed a fever since your procedure? no  2.   Have you had an respiratory symptoms (SOB or cough) since your procedure? no  3.   Have you tested positive for COVID 19 since your procedure no  4.   Have you had any family members/close contacts diagnosed with the COVID 19 since your procedure?  no   If yes to any of these questions please route to Joylene John, RN and Erenest Rasher, RN Follow up Call-  Call back number 12/17/2019 09/02/2018  Post procedure Call Back phone  # [phone]  Permission to leave phone message Yes Yes  Some recent data might be hidden     Patient questions:  Do you have a fever, pain , or abdominal swelling? No. Pain Score  0 *  Have you tolerated food without any problems? Yes.    Have you been able to return to your normal activities? Yes.    Do you have any questions about your discharge instructions: Diet   No. Medications  No. Follow up visit  No.  Do you have questions or concerns about your Care? No.  Actions: * If pain score is 4 or above: No action needed, pain <4.

## 2019-12-29 ENCOUNTER — Encounter: Payer: Self-pay | Admitting: Gastroenterology

## 2020-07-05 MED FILL — ESTRADIOL 0.1 MG/GM CREA: 0.1 | 90 days supply | Qty: 43 | Fill #0

## 2020-07-14 MED FILL — ESTRADIOL 0.1 MG/GM CREA: 0.1 | 90 days supply | Qty: 43 | Fill #0

## 2020-07-20 ENCOUNTER — Other Ambulatory Visit: Payer: Self-pay

## 2020-07-20 ENCOUNTER — Encounter: Payer: Self-pay | Admitting: Internal Medicine

## 2020-07-20 ENCOUNTER — Ambulatory Visit (INDEPENDENT_AMBULATORY_CARE_PROVIDER_SITE_OTHER): Payer: No Typology Code available for payment source | Admitting: Internal Medicine

## 2020-07-20 VITALS — BP 102/78 | HR 61 | Temp 98.3°F | Wt 136.2 lb

## 2020-07-20 DIAGNOSIS — R030 Elevated blood-pressure reading, without diagnosis of hypertension: Secondary | ICD-10-CM

## 2020-07-20 DIAGNOSIS — R238 Other skin changes: Secondary | ICD-10-CM

## 2020-07-20 NOTE — Progress Notes (Signed)
Established Patient Office Visit     This visit occurred during the SARS-CoV-2 public health emergency.  Safety protocols were in place, including screening questions prior to the visit, additional usage of staff PPE, and extensive cleaning of exam room while observing appropriate contact time as indicated for disinfecting solutions.    CC/Reason for Visit: Monitor blood pressure, assess skin lesion  HPI: Sandra Barnes is a 64 y.o. female who is coming in today for the above mentioned reasons.  She was noted to have elevated blood pressure at her last office visit, she has not been diagnosed as hypertensive.  She would like me to look at a papule that she has over her left cheek.  She feels that at times it gets inflamed and desquamates.  She is concerned that it might be skin cancer.   Past Medical/Surgical History: Past Medical History:  Diagnosis Date  . Elevated bilirubin August 2016  . H/O abnormal Pap smear    age 30  . Malaria 1991    Past Surgical History:  Procedure Laterality Date  . COLONOSCOPY  11/09   Dr.Brodie   . 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 She has never used smokeless tobacco. She reports current alcohol use of about 7.0 standard drinks of alcohol per week. She reports that she does not use drugs.  Allergies: No Known Allergies  Family History:  Family History  Problem Relation Age of Onset  . Kidney disease Maternal Grandfather        polycystic kidneys  . Lung cancer Mother   . Heart Problems Father   . Colon cancer Neg Hx   . Colon polyps Neg Hx   . Esophageal cancer Neg Hx   . Rectal cancer Neg Hx   . Stomach cancer Neg Hx      Current Outpatient Medications:  .  estradiol (ESTRACE) 0.1 MG/GM vaginal cream, 1 gram vaginally twice weekly at hs, Disp: 42.5 g, Rfl: 1 .  valACYclovir (VALTREX) 500 MG tablet, Take 1 tablet (500 mg total) by mouth 2 (two) times daily. Take  as directed., Disp: 30 tablet, Rfl: 1 .  zolpidem (AMBIEN) 10 MG tablet, Take 1 tablet (10 mg total) by mouth at bedtime as needed for sleep., Disp: 30 tablet, Rfl: 1  Review of Systems:  Constitutional: Denies fever, chills, diaphoresis, appetite change and fatigue.  HEENT: Denies photophobia, eye pain, redness, hearing loss, ear pain, congestion, sore throat, rhinorrhea, sneezing, mouth sores, trouble swallowing, neck pain, neck stiffness and tinnitus.   Respiratory: Denies SOB, DOE, cough, chest tightness,  and wheezing.   Cardiovascular: Denies chest pain, palpitations and leg swelling.  Gastrointestinal: Denies nausea, vomiting, abdominal pain, diarrhea, constipation, blood in stool and abdominal distention.  Genitourinary: Denies dysuria, urgency, frequency, hematuria, flank pain and difficulty urinating.  Endocrine: Denies: hot or cold intolerance, sweats, changes in hair or nails, polyuria, polydipsia. Musculoskeletal: Denies myalgias, back pain, joint swelling, arthralgias and gait problem.  Skin: Denies pallor and wound.  Neurological: Denies dizziness, seizures, syncope, weakness, light-headedness, numbness and headaches.  Hematological: Denies adenopathy. Easy bruising, personal or family bleeding history  Psychiatric/Behavioral: Denies suicidal ideation, mood changes, confusion, nervousness, sleep disturbance and agitation    Physical Exam: Vitals:   07/20/20 1114  BP: 102/78  Pulse: 61  Temp: 98.3 F (36.8 C)  TempSrc: Oral  SpO2: 97%  Weight: 136 lb 3.2 oz (61.8 kg)  Body mass index is 21.98 kg/m.   Constitutional: NAD, calm, comfortable Eyes: PERRL, lids and conjunctivae normal, wears corrective lenses ENMT: Mucous membranes are moist.  Skin: Small, barely visible, slightly erythematous papule over her left cheekbone. Neurologic: Grossly intact and nonfocal Psychiatric: Normal judgment and insight. Alert and oriented x 3. Normal mood.    Impression and  Plan:  Papule of skin  - Plan: Ambulatory referral to DermatologyPer patient request. -This does not appear to be skin cancer to me.  Elevated BP without diagnosis of hypertension -Blood pressure in office today is normal, continue monitoring.     Lelon Frohlich, MD [facility] at [facility]

## 2020-08-15 ENCOUNTER — Encounter: Payer: Self-pay | Admitting: Internal Medicine

## 2020-08-15 DIAGNOSIS — H539 Unspecified visual disturbance: Secondary | ICD-10-CM

## 2020-08-16 ENCOUNTER — Ambulatory Visit (INDEPENDENT_AMBULATORY_CARE_PROVIDER_SITE_OTHER): Payer: No Typology Code available for payment source | Admitting: Ophthalmology

## 2020-08-16 ENCOUNTER — Encounter (INDEPENDENT_AMBULATORY_CARE_PROVIDER_SITE_OTHER): Payer: Self-pay | Admitting: Ophthalmology

## 2020-08-16 ENCOUNTER — Other Ambulatory Visit: Payer: Self-pay

## 2020-08-16 DIAGNOSIS — H43812 Vitreous degeneration, left eye: Secondary | ICD-10-CM | POA: Diagnosis not present

## 2020-08-16 DIAGNOSIS — H2513 Age-related nuclear cataract, bilateral: Secondary | ICD-10-CM | POA: Diagnosis not present

## 2020-08-16 MED FILL — VALACYCLOVIR HCL 500 MG TAB: 500 | 15 days supply | Qty: 30 | Fill #1

## 2020-08-16 NOTE — Patient Instructions (Signed)
Vitreous Detachment  Vitreous detachment is part of the normal aging process in the eyes. Vitreous is the jelly-like substance that makes up most of the inside of the eyeballs. It helps the eyeballs keep a round shape. The vitreous is attached to the retina of the eye with a series of fibers. As you age, the vitreous gradually shrinks. Tension increases between the fibers and the retina. Eventually, the fibers can break free from the retina, causing vitreous detachment. In most cases, this does not cause problems and does not require treatment. However, it can sometimes cause the retina to separate from the eyeball (retinal detachment), which requires treatment to prevent vision loss. What are the causes? Aging is the main cause of vitreous detachment. Everyone's vitreous naturally shrinks with age. What increases the risk? You are more likely to have vitreous detachment if you:  Are at least 62 years old.  Have inflammation of the eye.  Have an eye injury.  Have had eye surgery.  Have a hemorrhage in your eye.  Are very nearsighted (myopia).  Have diabetes. What are the signs or symptoms? Most people with this condition will not notice any symptoms. If symptoms do occur, the most common are floaters. Floaters occur as the vitreous begins to shrink. They may:  Appear as tiny dots or webs in your vision.  Seem to disappear when you look at them directly.  Appear more often as your condition gets worse. Other symptoms include:  Flashes of light (photopsia) in your peripheral vision that may look like lightning streaks.  Decreased vision or a dark curtain or shadow moving across your field of vision. This is rare. How is this diagnosed? This condition may be diagnosed based on:  Your signs and symptoms.  An exam by a health care provider who specializes in conditions and diseases of the eye (ophthalmologist). The exam may include: ? Putting eye drops in your eye to make the  pupil wider (dilated). The pupil is the opening in the center of the eye. ? Checking the pupils with a magnifying glass. This exam is the best way to determine the type and extent of damage to your eye. How is this treated? For most people, a vitreous detachment is harmless, causing no symptoms or vision loss, and does not require treatment. Floaters usually become less noticeable over time. If the condition causes retinal detachment, you may need eye surgery to reattach your retina (reattachment surgery) in order to prevent vision loss or restore your vision. Follow these instructions at home:  Keep all follow-up visits as told by your health care provider. This is important. Get help right away if:  You develop signs of retinal detachment. These include: ? A sudden increase in the number of floaters you see. ? An increase in the number of flashes of light you see in your peripheral vision. ? Decreased vision. Summary  Vitreous detachment is part of the normal aging process in the eyes.  Vitreous is the jelly-like substance inside the eyeballs. As you age, the vitreous shrinks, and the fibers that attach the vitreous to the retina can break free, causing vitreous detachment.  In most cases, vitreous detachment does not cause symptoms and does not require treatment. The most common symptom that can occur is seeing floaters that appear as tiny dots or webs in your vision.  Vitreous detachment can sometimes cause the retina to separate from the eyeball (retinal detachment). This must be treated to prevent vision loss. This information is not  intended to replace advice given to you by your health care provider. Make sure you discuss any questions you have with your health care provider. Document Revised: 08/24/2017 Document Reviewed: 08/02/2017 Elsevier Patient Education  2020 [employer].

## 2020-08-16 NOTE — Progress Notes (Signed)
08/16/2020     CHIEF COMPLAINT Patient presents for Retina Evaluation   HISTORY OF PRESENT ILLNESS: Sandra Barnes is a 64 y.o. female who presents to the clinic today for:   HPI    Retina Evaluation    In left eye.  This started 1 day ago.  Duration of 1 day.  Associated Symptoms Flashes and Floaters.  Treatments tried include no treatments.  Response to treatment was no improvement.          Comments    NP Flashes and Floaters OS  Pt c/o floaters x 1 day OS approx. Pt sts she can see the floaters when she moves her head. Pt sts floaters are constant. Pt c/o flashes noticeable last night temporally OS off and on, more noticeable in the dark. Pt sts symptoms are "subtle." Pt denies ocular pain or changes to [place] OU.       Last edited by Rockie Neighbours, [place] on 08/16/2020 10:19 AM. (History)      Referring physician: Isaac Bliss, Rayford Halsted, MD [place],  [place] [id number]  HISTORICAL INFORMATION:   Selected notes from the [facility]: No current outpatient medications on file. (Ophthalmic Drugs)   No current facility-administered medications for this visit. (Ophthalmic Drugs)   Current Outpatient Medications (Other)  Medication Sig  . estradiol (ESTRACE) 0.1 MG/GM vaginal cream 1 gram vaginally twice weekly at hs  . valACYclovir (VALTREX) 500 MG tablet Take 1 tablet (500 mg total) by mouth 2 (two) times daily. Take as directed.  . zolpidem (AMBIEN) 10 MG tablet Take 1 tablet (10 mg total) by mouth at bedtime as needed for sleep.   No current facility-administered medications for this visit. (Other)      REVIEW OF SYSTEMS:    ALLERGIES No Known Allergies  PAST MEDICAL HISTORY Past Medical History:  Diagnosis Date  . Elevated bilirubin August 2016  . H/O abnormal Pap smear    age 89  . Malaria 1991   Past Surgical History:  Procedure Laterality Date  . COLONOSCOPY  11/09   Dr.Brodie   .  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 Kidney disease Maternal Grandfather        polycystic kidneys  . Lung cancer Mother   . Heart Problems Father   . Colon cancer Neg Hx   . Colon polyps Neg Hx   . Esophageal cancer Neg Hx   . Rectal cancer Neg Hx   . Stomach cancer Neg Hx     SOCIAL HISTORY Social History   Tobacco Use  . Smoking status: Never Smoker  . Smokeless tobacco: Never Used  Vaping Use  . Vaping Use: Never used  Substance Use Topics  . Alcohol use: Yes    Alcohol/week: 7.0 standard drinks    Types: 7 Standard drinks or equivalent per week  . Drug use: No         OPHTHALMIC EXAM:  Base Eye Exam    Visual Acuity (ETDRS)      Right Left   Dist cc 20/25 +2 20/20 -1   Correction: Glasses       Tonometry (Tonopen, 10:21 AM)      Right Left   Pressure 13 12       Pupils      Pupils Dark Light Shape React APD  Right PERRL 4 3 Round Brisk None   Left PERRL 4 3 Round Brisk None       Visual Fields (Counting fingers)      Left Right    Full Full       Extraocular Movement      Right Left    Full Full       Neuro/Psych    Oriented x3: Yes   Mood/Affect: Normal       Dilation    Both eyes: 1.0% Mydriacyl, 2.5% Phenylephrine @ 10:22 AM        Slit Lamp and Fundus Exam    External Exam      Right Left   External Normal Normal       Slit Lamp Exam      Right Left   Lids/Lashes Normal Normal   Conjunctiva/Sclera White and quiet White and quiet   Cornea Clear Clear   Anterior Chamber Deep and quiet Deep and quiet   Iris Round and reactive Round and reactive   Lens 1+ Nuclear sclerosis 1+ Nuclear sclerosis   Anterior Vitreous no Shafer's sign no Shafer's sign       Fundus Exam      Right Left   Posterior Vitreous Normal Posterior vitreous detachment   Disc Normal Normal   C/D Ratio 0.2 0.2   Macula Normal Normal   Vessels Normal Normal   Periphery Normal 25D  lens, scleral depressed exam, no holes or tears          IMAGING AND PROCEDURES  Imaging and Procedures for 08/16/20  Color Fundus Photography Optos - OU - Both Eyes       Right Eye Progression has no prior data. Macula : normal observations. Vessels : normal observations. Periphery : normal observations.   Left Eye Progression has no prior data. Disc findings include normal observations. Macula : normal observations. Vessels : normal observations. Periphery : normal observations.                 ASSESSMENT/PLAN:  Posterior vitreous detachment of left eye   The nature of posterior vitreous detachment was discussed with the patient as well as its physiology, its age prevalence, and its possible implication regarding retinal breaks and detachment.  An informational brochure was given to the patient.  All the patient's questions were answered.  The patient was asked to return if new or different flashes or floaters develops.   Patient was instructed to contact office immediately if any changes were noticed. I explained to the patient that vitreous inside the eye is similar to jello inside a bowl. As the jello melts it can start to pull away from the bowl, similarly the vitreous throughout our lives can begin to pull away from the retina. That process is called a posterior vitreous detachment. In some cases, the vitreous can tug hard enough on the retina to form a retinal tear. I discussed with the patient the signs and symptoms of a retinal detachment.  Do not rub the eye.      ICD-10-CM   1. Nuclear sclerotic cataract of both eyes  H25.13   2. Posterior vitreous detachment of left eye  H43.812 Color Fundus Photography Optos - OU - Both Eyes    1.  Follow-up with Alain Honey as scheduled  2.  3.  Ophthalmic Meds Ordered this visit:  No orders of the defined types were placed in this encounter.      Return in about 1 year (  around 08/16/2021) for DILATE OU, COLOR  FP.  Patient Instructions  Vitreous Detachment  Vitreous detachment is part of the normal aging process in the eyes. Vitreous is the jelly-like substance that makes up most of the inside of the eyeballs. It helps the eyeballs keep a round shape. The vitreous is attached to the retina of the eye with a series of fibers. As you age, the vitreous gradually shrinks. Tension increases between the fibers and the retina. Eventually, the fibers can break free from the retina, causing vitreous detachment. In most cases, this does not cause problems and does not require treatment. However, it can sometimes cause the retina to separate from the eyeball (retinal detachment), which requires treatment to prevent vision loss. What are the causes? Aging is the main cause of vitreous detachment. Everyone's vitreous naturally shrinks with age. What increases the risk? You are more likely to have vitreous detachment if you:  Are at least 62 years old.  Have inflammation of the eye.  Have an eye injury.  Have had eye surgery.  Have a hemorrhage in your eye.  Are very nearsighted (myopia).  Have diabetes. What are the signs or symptoms? Most people with this condition will not notice any symptoms. If symptoms do occur, the most common are floaters. Floaters occur as the vitreous begins to shrink. They may:  Appear as tiny dots or webs in your vision.  Seem to disappear when you look at them directly.  Appear more often as your condition gets worse. Other symptoms include:  Flashes of light (photopsia) in your peripheral vision that may look like lightning streaks.  Decreased vision or a dark curtain or shadow moving across your field of vision. This is rare. How is this diagnosed? This condition may be diagnosed based on:  Your signs and symptoms.  An exam by a health care provider who specializes in conditions and diseases of the eye (ophthalmologist). The exam may include: ? Putting eye  drops in your eye to make the pupil wider (dilated). The pupil is the opening in the center of the eye. ? Checking the pupils with a magnifying glass. This exam is the best way to determine the type and extent of damage to your eye. How is this treated? For most people, a vitreous detachment is harmless, causing no symptoms or vision loss, and does not require treatment. Floaters usually become less noticeable over time. If the condition causes retinal detachment, you may need eye surgery to reattach your retina (reattachment surgery) in order to prevent vision loss or restore your vision. Follow these instructions at home:  Keep all follow-up visits as told by your health care provider. This is important. Get help right away if:  You develop signs of retinal detachment. These include: ? A sudden increase in the number of floaters you see. ? An increase in the number of flashes of light you see in your peripheral vision. ? Decreased vision. Summary  Vitreous detachment is part of the normal aging process in the eyes.  Vitreous is the jelly-like substance inside the eyeballs. As you age, the vitreous shrinks, and the fibers that attach the vitreous to the retina can break free, causing vitreous detachment.  In most cases, vitreous detachment does not cause symptoms and does not require treatment. The most common symptom that can occur is seeing floaters that appear as tiny dots or webs in your vision.  Vitreous detachment can sometimes cause the retina to separate from the eyeball (retinal detachment).  This must be treated to prevent vision loss. This information is not intended to replace advice given to you by your health care provider. Make sure you discuss any questions you have with your health care provider. Document Revised: 08/24/2017 Document Reviewed: 08/02/2017 Elsevier Patient Education  2020 [employer].     Explained the diagnoses, plan, and follow up with the patient and  they expressed understanding.  Patient expressed understanding of the importance of proper follow up care.   Clent Demark Lalo Tromp M.D. Diseases & Surgery of the Retina and Vitreous [facility]ina [facility] Diabetic [place] 08/16/20     Abbreviations: M myopia (nearsighted); A astigmatism; H hyperopia (farsighted); P presbyopia; Mrx spectacle prescription;  CTL contact lenses; OD right eye; OS left eye; OU both eyes  XT exotropia; ET esotropia; PEK punctate epithelial keratitis; PEE punctate epithelial erosions; DES dry eye syndrome; MGD meibomian gland dysfunction; ATs artificial tears; PFAT's preservative free artificial tears; [place] nuclear sclerotic cataract; PSC posterior subcapsular cataract; ERM epi-retinal membrane; PVD posterior vitreous detachment; RD retinal detachment; DM diabetes mellitus; DR diabetic retinopathy; NPDR non-proliferative diabetic retinopathy; PDR proliferative diabetic retinopathy; CSME clinically significant macular edema; DME diabetic macular edema; dbh dot blot hemorrhages; CWS cotton wool spot; POAG primary open angle glaucoma; C/D cup-to-disc ratio; HVF humphrey visual field; GVF goldmann visual field; OCT optical coherence tomography; IOP intraocular pressure; BRVO Branch retinal vein occlusion; CRVO central retinal vein occlusion; CRAO central retinal artery occlusion; BRAO branch retinal artery occlusion; RT retinal tear; SB scleral buckle; PPV pars plana vitrectomy; VH Vitreous hemorrhage; PRP panretinal laser photocoagulation; IVK intravitreal kenalog; VMT vitreomacular traction; MH Macular hole;  NVD neovascularization of the disc; NVE neovascularization elsewhere; AREDS age related eye disease study; ARMD age related macular degeneration; POAG primary open angle glaucoma; EBMD epithelial/anterior basement membrane dystrophy; ACIOL anterior chamber intraocular lens; IOL intraocular lens; PCIOL posterior chamber intraocular lens; Phaco/IOL phacoemulsification with intraocular lens  placement; [place] photorefractive keratectomy; LASIK laser assisted in situ keratomileusis; HTN hypertension; DM diabetes mellitus; COPD chronic obstructive pulmonary disease

## 2020-08-16 NOTE — Assessment & Plan Note (Signed)

## 2020-08-31 NOTE — Progress Notes (Signed)
64 y.o. G27P2002 Married White or Caucasian Not Hispanic or Latino female here for annual exam. She is sexually active, uses vaginal estrogen cream but finds it messy. Still has mild dyspareunia, even with use of lubrication.   Mom is in independent living locally, has lots of medical issues.   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 Much better.   Just occasional HSV.   She notices an intermittent vaginal odor.   She uses ambien on occasion, 30 tablets lasts her a year.     Patient's last menstrual period was 08/26/2011.          Sexually active: Yes.    The current method of family planning is post menopausal status.    Exercising: Yes.    Running Hiking walking  Smoker:  no  Health Maintenance: Pap:  05/31/16 Neg. HR HPV:neg  12/30/12 Neg  History of abnormal Pap:  Yes when she was 19-20 had cryo normal since.  MMG:  09/10/19 density c B-rads 1  BMD:   None  Colonoscopy: 12/17/19 f/u 5 years  TDaP:  05/31/16  Gardasil: none    reports that she has never smoked. She has never used smokeless tobacco. She reports current alcohol use of about 7.0 standard drinks of alcohol per week. She reports that she does not use drugs. 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  2 kids, one grandchild.Husband is also a FP, MD  Past Medical History:  Diagnosis Date  . Elevated bilirubin August 2016  . H/O abnormal Pap smear    age 17  . Malaria 1991    Past Surgical History:  Procedure Laterality Date  . COLONOSCOPY  11/09   Dr.Brodie   . 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 estradiol (ESTRACE) 0.1 MG/GM vaginal cream 1 gram vaginally twice weekly at hs 42.5 g 1  . valACYclovir (VALTREX) 500 MG tablet Take 1 tablet (500 mg total) by mouth 2 (two) times daily. Take as directed. 30 tablet 1  . zolpidem (AMBIEN) 10 MG tablet Take 1 tablet (10 mg total) by mouth at bedtime as needed for sleep. 30  tablet 1   No current facility-administered medications for this visit.    Family History  Problem Relation Age of Onset  . Kidney disease Maternal Grandfather        polycystic kidneys  . Lung cancer Mother   . Heart Problems Father   . Colon cancer Neg Hx   . Colon polyps Neg Hx   . Esophageal cancer Neg Hx   . Rectal cancer Neg Hx   . Stomach cancer Neg Hx     Review of Systems  Eyes: Positive for itching.  All other systems reviewed and are negative.   Exam:   LMP 08/26/2011 Comment: GYN  Weight change: @WEIGHTCHANGE @ Height:      Ht Readings from Last 3 Encounters:  12/17/19 5\' 6"  (1.676 m)  12/03/19 5\' 6"  (1.676 m)  10/21/19 5\' 6"  (1.676 m)    General appearance: alert, cooperative and appears stated age Head: Normocephalic, without obvious abnormality, atraumatic Neck: no adenopathy, supple, symmetrical, trachea midline and thyroid normal to inspection and palpation Lungs: clear to auscultation bilaterally Cardiovascular: regular rate and rhythm Breasts: normal appearance, no masses or tenderness Abdomen: soft, non-tender; non distended,  no masses,  no organomegaly Extremities: extremities normal, atraumatic, no cyanosis or edema Skin: Skin color, texture, turgor normal. No rashes  or lesions Lymph nodes: Cervical, supraclavicular, and axillary nodes normal. No abnormal inguinal nodes palpated Neurologic: Grossly normal   Pelvic: External genitalia:  no lesions              Urethra:  normal appearing urethra with no masses, tenderness or lesions              Bartholins and Skenes: normal                 Vagina: atrophic appearing vagina with minimal white vaginal d/c noted              Cervix: no lesions               Bimanual Exam:  Uterus:  normal size, contour, position, consistency, mobility, non-tender and anteverted              Adnexa: no mass, fullness, tenderness               Rectovaginal: Confirms               Anus:  normal sphincter tone, no  lesions  Gae Dry chaperoned for the exam.  A:  Well Woman with normal exam  Dyspareunia  Vaginal atrophy  Vaginal odor  P:   Mammogram due, she will schedule  Colonoscopy UTD  Discussed breast self exam  Discussed calcium and vit D intake   No labs this year  Refill of valtrex  Change to vaginal estradiol tablets  Try vaginal dilators, continue to use lubrication  She has had her 3 covid vaccinations, and her flu shot.   Send nuswab for BV

## 2020-09-01 ENCOUNTER — Encounter: Payer: Self-pay | Admitting: Obstetrics and Gynecology

## 2020-09-01 ENCOUNTER — Other Ambulatory Visit: Payer: Self-pay

## 2020-09-01 ENCOUNTER — Other Ambulatory Visit: Payer: Self-pay | Admitting: Obstetrics and Gynecology

## 2020-09-01 ENCOUNTER — Ambulatory Visit (INDEPENDENT_AMBULATORY_CARE_PROVIDER_SITE_OTHER): Payer: No Typology Code available for payment source | Admitting: Obstetrics and Gynecology

## 2020-09-01 VITALS — BP 110/62 | HR 58 | Ht 66.0 in | Wt 134.8 lb

## 2020-09-01 DIAGNOSIS — Z01419 Encounter for gynecological examination (general) (routine) without abnormal findings: Secondary | ICD-10-CM | POA: Diagnosis not present

## 2020-09-01 DIAGNOSIS — N941 Unspecified dyspareunia: Secondary | ICD-10-CM

## 2020-09-01 DIAGNOSIS — N898 Other specified noninflammatory disorders of vagina: Secondary | ICD-10-CM | POA: Diagnosis not present

## 2020-09-01 DIAGNOSIS — N952 Postmenopausal atrophic vaginitis: Secondary | ICD-10-CM

## 2020-09-01 MED ORDER — VALACYCLOVIR HCL 500 MG PO TABS: ORAL_TABLET | ORAL | 2 refills | Status: DC

## 2020-09-01 MED ORDER — ZOLPIDEM TARTRATE 10 MG PO TABS: 10.0000 mg | ORAL_TABLET | Freq: Every evening | ORAL | 1 refills | Status: DC | PRN

## 2020-09-01 MED ORDER — ESTRADIOL 10 MCG VA TABS: ORAL_TABLET | VAGINAL | 3 refills | Status: DC

## 2020-09-01 MED FILL — ESTRADIOL 10 MCG TABS: 10 | 84 days supply | Qty: 24 | Fill #0

## 2020-09-01 MED FILL — VALACYCLOVIR HCL 500 MG TAB: 500 | 15 days supply | Qty: 30 | Fill #0

## 2020-09-01 NOTE — Patient Instructions (Signed)

## 2020-09-02 ENCOUNTER — Ambulatory Visit: Payer: No Typology Code available for payment source | Admitting: Internal Medicine

## 2020-09-04 LAB — BACTERIAL VAGINOSIS, NAA
Atopobium vaginae: HIGH Score — AB
Megasphaera 1: HIGH Score — AB

## 2020-09-06 ENCOUNTER — Telehealth: Payer: Self-pay

## 2020-09-06 ENCOUNTER — Other Ambulatory Visit: Payer: Self-pay | Admitting: Obstetrics and Gynecology

## 2020-09-06 NOTE — Telephone Encounter (Signed)
-----   Message from Salvadore Dom, MD sent at 09/05/2020  6:48 AM EST ----- Please inform the patient that her vaginitis probe was + for BV and treat with flagyl (either oral or vaginal, her choice), no ETOH while on Flagyl.  Oral: Flagyl 500 mg BID x 7 days, or Vaginal: Metrogel, 1 applicator per vagina q day x 5 days.

## 2020-09-06 NOTE — Telephone Encounter (Signed)
Call made to patient to verify what method she would like to use for treatment. Patient to call back.

## 2020-09-06 NOTE — Telephone Encounter (Signed)
Patient returned call. She would like the vaginal metrogel called in.

## 2020-09-07 ENCOUNTER — Other Ambulatory Visit: Payer: Self-pay

## 2020-09-07 DIAGNOSIS — N760 Acute vaginitis: Secondary | ICD-10-CM

## 2020-09-07 MED ORDER — METRONIDAZOLE 0.75 % VA GEL: 1.0000 | Freq: Every day | VAGINAL | 0 refills | Status: DC

## 2020-09-07 MED FILL — metroNIDAZOLE 0.75 % GEL: 0.75 | 7 days supply | Qty: 70 | Fill #0

## 2020-09-07 MED FILL — ZOLPIDEM TARTRATE 10 MG TAB: 10 | 30 days supply | Qty: 30 | Fill #0

## 2020-09-07 NOTE — Telephone Encounter (Signed)
Spoke with patient. Vaginal metrogel called in

## 2020-09-07 NOTE — Progress Notes (Signed)
Sent in Metrogel and refaxed ambien rx to [facility] out patient [facility].

## 2020-09-30 ENCOUNTER — Encounter: Payer: No Typology Code available for payment source | Admitting: Internal Medicine

## 2020-10-14 DIAGNOSIS — Z20822 Contact with and (suspected) exposure to covid-19: Secondary | ICD-10-CM | POA: Diagnosis not present

## 2020-10-14 DIAGNOSIS — Z03818 Encounter for observation for suspected exposure to other biological agents ruled out: Secondary | ICD-10-CM | POA: Diagnosis not present

## 2020-10-27 ENCOUNTER — Other Ambulatory Visit: Payer: Self-pay | Admitting: Obstetrics and Gynecology

## 2020-10-27 DIAGNOSIS — Z1231 Encounter for screening mammogram for malignant neoplasm of breast: Secondary | ICD-10-CM

## 2020-11-01 DIAGNOSIS — Z1231 Encounter for screening mammogram for malignant neoplasm of breast: Secondary | ICD-10-CM

## 2020-11-10 ENCOUNTER — Ambulatory Visit (INDEPENDENT_AMBULATORY_CARE_PROVIDER_SITE_OTHER): Payer: 59 | Admitting: Family Medicine

## 2020-11-10 ENCOUNTER — Other Ambulatory Visit: Payer: Self-pay

## 2020-11-10 ENCOUNTER — Ambulatory Visit
Admission: RE | Admit: 2020-11-10 | Discharge: 2020-11-10 | Disposition: A | Discharge status: Home / Self Care | Payer: 59 | Source: Ambulatory Visit | Attending: Family Medicine | Admitting: Family Medicine

## 2020-11-10 VITALS — BP 118/72 | Ht 66.0 in | Wt 135.0 lb

## 2020-11-10 DIAGNOSIS — M25551 Pain in right hip: Secondary | ICD-10-CM

## 2020-11-10 DIAGNOSIS — M1611 Unilateral primary osteoarthritis, right hip: Secondary | ICD-10-CM | POA: Diagnosis not present

## 2020-11-10 NOTE — Progress Notes (Addendum)
    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  It is mostly located over the lateral and posterior area of her right hip and sometimes radiates down the lateral side of her leg to the knee.  She is very active and continues to walk and hike she does have pain when she does these activities but it is bearable and she can finish the hikes.  She has had no recent trauma and is not aware of any inciting event which would have started her symptoms.  She has difficulty sleeping at night and wakes up multiple times due to the achy dull pain switches positions and it gets better.  It is also worse in the morning and if she sits for long periods of time and then goes to get up.  She has a history of right sciatic nerve pain but states this feels different as she has no numbness or tingling.  She has never had pain like this before.  Pain is described as a dull ache.  PERTINENT  PMH / PSH: History of right sciatica  OBJECTIVE:   BP 118/72   Ht 5\' 6"  (1.676 m)   Wt 135 lb (61.2 kg)   LMP 08/26/2011 Comment: GYN  BMI 21.79 kg/m   No flowsheet data found.  Hip, Right: No obvious rash, erythema, ecchymosis, or edema. TTP in multiple areas but area of maximal tenderness is at the posterior greater trochanter. ROM full in all directions and without pain; Strength 5/5 in IR/ER/Flex/Ext and Adduction but weakness of abduction when compared to the left side. No pain with internal or external rotation. Pelvic alignment unremarkable to inspection and palpation. Greater trochanter with minimal tenderness to palpation. No tenderness over piriformis. No SI joint tenderness. Special Test:   - Ober's test: NEG   - Passive Log Roll test: NEG    Limited MSK U/S: Right Hip Visualization of the right greater trochanter shows no signs of greater trochanteric  bursitis. Insertion of the gluteus medius shows no hypoechoic areas, no calcific changes at the tendon insertion on the posterior greater trochanter. Impression: Normal U/S of the right posterior hip.  ASSESSMENT/PLAN:   Right hip pain Patient with 6 weeks of right hip pain mainly located at the posterior of the greater trochanter with weakness on hip abduction against resistance on exam today and a normal ultrasound.  Most likely her pain is from weakness and possible overuse of the hip abductors.  No obvious signs of any chronic tendinopathy or active acute tendinitis. -Given home exercises to help strengthen the hip abductors and she can continue doing the IT band stretches -Ibuprofen 600mg  2-3 times per day for the next 7 to 10 days then as needed -Hip x-ray to rule out any possible osteoarthritis is contributing to the pathology profundas less likely -Follow-up in 3 weeks     Nuala Alpha, DO PGY-4, Sports Medicine Fellow [place]  Addendum:  I was the preceptor for this visit and available for immediate consultation.  Karlton Lemon MD Kirt Boys

## 2020-11-10 NOTE — Assessment & Plan Note (Signed)
Patient with 6 weeks of right hip pain mainly located at the posterior of the greater trochanter with weakness on hip abduction against resistance on exam today and a normal ultrasound.  Most likely her pain is from weakness and possible overuse of the hip abductors.  No obvious signs of any chronic tendinopathy or active acute tendinitis. -Given home exercises to help strengthen the hip abductors and she can continue doing the IT band stretches -Ibuprofen 600mg  2-3 times per day for the next 7 to 10 days then as needed -Hip x-ray to rule out any possible osteoarthritis is contributing to the pathology profundas less likely -Follow-up in 3 weeks

## 2020-11-10 NOTE — Patient Instructions (Addendum)
It was great to see you today! Thank you for letting me participate in your care!  Today, we discussed your right hip pain that I think is likely from weakness your hip abductors. Your exam and ultrasound was reassuring for no tearing or bursitis. You may also have an element of IT band syndrome. Continue doing the exercises you were before for IT band and start the ones I gave you today. Please take Ibuprofen 600mg  2-3 times per day for the next 7-10 days and then as needed. I am also ordering a hip x-ray today as well. If anything is abnormal I will call you.  I will see you back in 3 weeks!  Be well, Harolyn Rutherford, DO PGY-4, Sports Medicine Fellow [place]

## 2020-11-15 ENCOUNTER — Other Ambulatory Visit: Payer: Self-pay | Admitting: Family Medicine

## 2020-11-15 DIAGNOSIS — M898X5 Other specified disorders of bone, thigh: Secondary | ICD-10-CM

## 2020-11-15 NOTE — Progress Notes (Signed)
Sclerotic lesion most likely benign found on x-ray. Recommending 3-6 month re-imaging for follow up to ensure stability.  Placing future order.

## 2020-11-19 ENCOUNTER — Other Ambulatory Visit: Payer: Self-pay | Admitting: Obstetrics and Gynecology

## 2020-11-19 DIAGNOSIS — Z1231 Encounter for screening mammogram for malignant neoplasm of breast: Secondary | ICD-10-CM

## 2020-12-08 ENCOUNTER — Ambulatory Visit: Payer: 59 | Admitting: Family Medicine

## 2020-12-29 ENCOUNTER — Other Ambulatory Visit: Payer: Self-pay

## 2020-12-29 ENCOUNTER — Ambulatory Visit
Admission: RE | Admit: 2020-12-29 | Discharge: 2020-12-29 | Disposition: A | Discharge status: Home / Self Care | Payer: 59 | Source: Ambulatory Visit

## 2020-12-29 DIAGNOSIS — Z1231 Encounter for screening mammogram for malignant neoplasm of breast: Secondary | ICD-10-CM | POA: Diagnosis not present

## 2020-12-31 ENCOUNTER — Other Ambulatory Visit (HOSPITAL_COMMUNITY): Payer: Self-pay

## 2020-12-31 MED FILL — Estradiol Vaginal Tab 10 MCG: VAGINAL | 84 days supply | Qty: 24 | Fill #0 | Status: CN

## 2020-12-31 MED FILL — Estradiol Vaginal Tab 10 MCG: VAGINAL | 28 days supply | Qty: 8 | Fill #0 | Status: CN

## 2021-01-10 ENCOUNTER — Other Ambulatory Visit (HOSPITAL_COMMUNITY): Payer: Self-pay

## 2021-01-10 MED ORDER — ZOSTER VAC RECOMB ADJUVANTED 50 MCG/0.5ML IM SUSR
INTRAMUSCULAR | 1 refills | Status: DC
  Filled 2021-01-10: qty 0.5, 1d supply, fill #0
  Filled 2021-03-14: qty 1, 1d supply, fill #1
  Filled 2021-03-15 (×2): qty 0.5, 1d supply, fill #1

## 2021-02-14 ENCOUNTER — Other Ambulatory Visit (HOSPITAL_COMMUNITY): Payer: Self-pay

## 2021-02-14 MED ORDER — AMOXICILLIN 500 MG PO CAPS
500.0000 mg | ORAL_CAPSULE | Freq: Three times a day (TID) | ORAL | 0 refills | Status: DC
  Filled 2021-02-14: qty 21, 7d supply, fill #0

## 2021-03-14 ENCOUNTER — Other Ambulatory Visit (HOSPITAL_COMMUNITY): Payer: Self-pay

## 2021-03-15 ENCOUNTER — Other Ambulatory Visit (HOSPITAL_COMMUNITY): Payer: Self-pay

## 2021-03-18 ENCOUNTER — Ambulatory Visit
Admission: RE | Admit: 2021-03-18 | Discharge: 2021-03-18 | Disposition: A | Discharge status: Home / Self Care | Payer: 59 | Source: Ambulatory Visit | Attending: Family Medicine | Admitting: Family Medicine

## 2021-03-18 DIAGNOSIS — M1611 Unilateral primary osteoarthritis, right hip: Secondary | ICD-10-CM | POA: Diagnosis not present

## 2021-03-18 DIAGNOSIS — M898X5 Other specified disorders of bone, thigh: Secondary | ICD-10-CM

## 2021-05-31 DIAGNOSIS — Z23 Encounter for immunization: Secondary | ICD-10-CM | POA: Diagnosis not present

## 2021-06-27 ENCOUNTER — Other Ambulatory Visit (HOSPITAL_COMMUNITY): Payer: Self-pay

## 2021-06-27 MED ORDER — AMOXICILLIN 500 MG PO CAPS
500.0000 mg | ORAL_CAPSULE | Freq: Three times a day (TID) | ORAL | 0 refills | Status: DC
  Filled 2021-06-27: qty 22, 7d supply, fill #0

## 2021-08-12 ENCOUNTER — Encounter (INDEPENDENT_AMBULATORY_CARE_PROVIDER_SITE_OTHER): Payer: Self-pay | Admitting: Ophthalmology

## 2021-08-16 ENCOUNTER — Encounter (INDEPENDENT_AMBULATORY_CARE_PROVIDER_SITE_OTHER): Payer: 59 | Admitting: Ophthalmology

## 2021-09-01 NOTE — Progress Notes (Signed)
65 y.o. G60P2002 Married White or Caucasian Not Hispanic or Latino female here for annual exam.  No vaginal bleeding.   Sexually active, uses vaginal estrogen cream. Minimal dyspareunia if she is using her estrogen consistently.    H/O HSV, only occasional outbreaks.  No bladder issues. Mild constipation, helped with prunes.   Patient's last menstrual period was 08/26/2011.          Sexually active: Yes.    The current method of family planning is post menopausal status.    Exercising: Yes.     Hiking Smoker:  no  Health Maintenance: Pap:  05/31/16 Neg. HR HPV:neg              12/30/12 Neg  History of abnormal Pap:  Yes when she was [age] had cryo normal since.  MMG:  12/31/20 Density B Bi-rads 1 neg  BMD:   none  Colonoscopy: 12/17/19 polyp f/u 5 years  TDaP:  05/31/16  Gardasil: n/a   reports that she has never smoked. She has never used smokeless tobacco. She reports current alcohol use of about 7.0 standard drinks per week. She reports that she does not use drugs. 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  2 kids, one grandchild (59 year old girl).  Husband is also a FP, MD  Past Medical History:  Diagnosis Date   Elevated bilirubin August 2016   H/O abnormal Pap smear    age 77   [place]    Past Surgical History:  Procedure Laterality Date   COLONOSCOPY  11/09   Dr.Brodie    GYNECOLOGIC CRYOSURGERY  1980   CIN[facility]    Current Outpatient Medications  Medication Sig Dispense Refill   amoxicillin (AMOXIL) 500 MG capsule Take 1 capsule (500 mg total) by mouth 3 (three) times daily unitl gone 21 capsule 0   amoxicillin (AMOXIL) 500 MG capsule Take 2 capsules by mouth now then 1 capsule 3 times a day until all medicine is gone for dental infection 22 capsule 0   zolpidem (AMBIEN) 10 MG tablet Take 1 tablet (10 mg total) by mouth at bedtime as needed for sleep. 30 tablet 1   zolpidem (AMBIEN) 10 MG tablet TAKE 1 TABLET BY MOUTH AT BEDTIME AS NEEDED FOR SLEEP 30 tablet 1   No  current facility-administered medications for this visit.    Family History  Problem Relation Age of Onset   Kidney disease Maternal Grandfather        polycystic kidneys   Lung cancer Mother    Heart Problems Father    Colon cancer Neg Hx    Colon polyps Neg Hx    Esophageal cancer Neg Hx    Rectal cancer Neg Hx    Stomach cancer Neg Hx     Review of Systems  All other systems reviewed and are negative.  Exam:   BP 122/62   Pulse 67   Ht 5\' 6"  (1.676 m)   Wt 136 lb (61.7 kg)   LMP 08/26/2011 Comment: GYN  SpO2 97%   BMI 21.95 kg/m   Weight change: @WEIGHTCHANGE @ Height:   Height: 5\' 6"  (167.6 cm)  Ht Readings from Last 3 Encounters:  09/07/21 5\' 6"  (1.676 m)  11/10/20 5\' 6"  (1.676 m)  09/01/20 5\' 6"  (1.676 m)    General appearance: alert, cooperative and appears stated age Head: Normocephalic, without obvious abnormality, atraumatic Neck: no adenopathy, supple, symmetrical, trachea midline and thyroid normal to inspection and palpation Lungs: clear to auscultation  bilaterally Cardiovascular: regular rate and rhythm Breasts: normal appearance, no masses or tenderness Abdomen: soft, non-tender; non distended,  no masses,  no organomegaly Extremities: extremities normal, atraumatic, no cyanosis or edema Skin: Skin color, texture, turgor normal. No rashes or lesions Lymph nodes: Cervical, supraclavicular, and axillary nodes normal. No abnormal inguinal nodes palpated Neurologic: Grossly normal   Pelvic: External genitalia:  no lesions              Urethra:  normal appearing urethra with no masses, tenderness or lesions              Bartholins and Skenes: normal                 Vagina: mildly atrophic appearing vagina with normal color and discharge, no lesions              Cervix: no lesions               Bimanual Exam:  Uterus:  normal size, contour, position, consistency, mobility, non-tender              Adnexa: no mass, fullness, tenderness                Rectovaginal: Confirms               Anus:  normal sphincter tone, no lesions  Gae Dry chaperoned for the exam.  1. Well woman exam Discussed breast self exam Discussed calcium and vit D intake Labs with primary Mammogram in 4/23 Colonoscopy is UTD  2. Screening for cervical cancer - Cytology - PAP  3. Vaginal atrophy Discussed vaginal lubrication - estradiol (ESTRACE) 0.1 MG/GM vaginal cream; Place one gram vaginally 2 x a week at hs  Dispense: 42.5 g; Refill: 1

## 2021-09-05 DIAGNOSIS — Z20822 Contact with and (suspected) exposure to covid-19: Secondary | ICD-10-CM | POA: Diagnosis not present

## 2021-09-06 ENCOUNTER — Other Ambulatory Visit (HOSPITAL_COMMUNITY): Payer: Self-pay

## 2021-09-06 MED ORDER — AMOXICILLIN 500 MG PO CAPS
ORAL_CAPSULE | ORAL | 0 refills | Status: DC
  Filled 2021-09-06: qty 22, 7d supply, fill #0

## 2021-09-07 ENCOUNTER — Other Ambulatory Visit: Payer: Self-pay

## 2021-09-07 ENCOUNTER — Other Ambulatory Visit (HOSPITAL_COMMUNITY): Payer: Self-pay

## 2021-09-07 ENCOUNTER — Encounter: Payer: Self-pay | Admitting: Obstetrics and Gynecology

## 2021-09-07 ENCOUNTER — Other Ambulatory Visit (HOSPITAL_COMMUNITY)
Admission: RE | Admit: 2021-09-07 | Discharge: 2021-09-07 | Disposition: A | Discharge status: Home / Self Care | Payer: 59 | Source: Ambulatory Visit | Attending: Obstetrics and Gynecology | Admitting: Obstetrics and Gynecology

## 2021-09-07 ENCOUNTER — Ambulatory Visit (INDEPENDENT_AMBULATORY_CARE_PROVIDER_SITE_OTHER): Payer: 59 | Admitting: Obstetrics and Gynecology

## 2021-09-07 VITALS — BP 122/62 | HR 67 | Ht 66.0 in | Wt 136.0 lb

## 2021-09-07 DIAGNOSIS — Z01419 Encounter for gynecological examination (general) (routine) without abnormal findings: Secondary | ICD-10-CM | POA: Diagnosis not present

## 2021-09-07 DIAGNOSIS — Z124 Encounter for screening for malignant neoplasm of cervix: Secondary | ICD-10-CM

## 2021-09-07 DIAGNOSIS — N952 Postmenopausal atrophic vaginitis: Secondary | ICD-10-CM

## 2021-09-07 MED ORDER — ESTRADIOL 0.1 MG/GM VA CREA: TOPICAL_CREAM | VAGINAL | 1 refills | Status: DC

## 2021-09-07 NOTE — Patient Instructions (Signed)

## 2021-09-09 LAB — CYTOLOGY - PAP
Comment: NEGATIVE
Diagnosis: NEGATIVE
Diagnosis: REACTIVE
High risk HPV: NEGATIVE

## 2021-10-11 ENCOUNTER — Other Ambulatory Visit (HOSPITAL_COMMUNITY): Payer: Self-pay

## 2022-02-17 ENCOUNTER — Other Ambulatory Visit: Payer: Self-pay | Admitting: Obstetrics and Gynecology

## 2022-02-17 DIAGNOSIS — Z1231 Encounter for screening mammogram for malignant neoplasm of breast: Secondary | ICD-10-CM

## 2022-03-01 ENCOUNTER — Ambulatory Visit
Admission: RE | Admit: 2022-03-01 | Discharge: 2022-03-01 | Disposition: A | Discharge status: Home / Self Care | Payer: 59 | Source: Ambulatory Visit

## 2022-03-01 DIAGNOSIS — Z1231 Encounter for screening mammogram for malignant neoplasm of breast: Secondary | ICD-10-CM | POA: Diagnosis not present

## 2022-08-30 NOTE — Progress Notes (Signed)
66 y.o. G73P2002 Married White or Caucasian Not Hispanic or Latino female here for annual exam.    H/O vaginal atrophy, helped with estrace cream. No dyspareunia.  No vaginal bleeding.   H/O HSV, rare outbreaks. Will call if she needs a Valtrex refill.   Patient's last menstrual period was 08/26/2011.          Sexually active: Yes.    The current method of family planning is post menopausal status.    Exercising: Yes.     Hiking and running  Smoker:  no  Health Maintenance: Pap: 09/07/21 neg, neg hpv; 05/31/16 Neg. HR HPV:neg              12/30/12 Neg  History of abnormal Pap:  Yes when she was [age] had cryo normal since.  MMG:  03/01/22 density C Bi-rads 1 neg  BMD:   none  Colonoscopy: 12/17/19 polyp f/u 5 years  TDaP:  05/31/16 Gardasil: n/a   reports that she has never smoked. She has never used smokeless tobacco. She reports current alcohol use of about 7.0 standard drinks of alcohol per week. She reports that she does not use drugs. 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  2 sons, one grandchild (almost 79 year old girl).  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 X 3 days prn     zolpidem (AMBIEN) 10 MG tablet Take 1 tablet (10 mg total) by mouth at bedtime as needed for sleep. 30 tablet 1   zolpidem (AMBIEN) 10 MG tablet TAKE 1 TABLET BY MOUTH AT BEDTIME AS NEEDED FOR SLEEP 30 tablet 1   No current facility-administered medications for this visit.    Family History  Problem Relation Age of Onset    Kidney disease Maternal Grandfather        polycystic kidneys   Lung cancer Mother    Heart Problems Father    Colon cancer Neg Hx    Colon polyps Neg Hx    Esophageal cancer Neg Hx    Rectal cancer Neg Hx    Stomach cancer Neg Hx     Review of Systems  All other systems reviewed and are negative.   Exam:   BP 104/70   Pulse 66   Ht '5\' 6"'$  (1.676 m)   Wt 137 lb (62.1 kg)   LMP 08/26/2011 Comment: GYN  SpO2 100%   BMI 22.11 kg/m   Weight change: '@WEIGHTCHANGE'$ @ Height:   Height: '5\' 6"'$  (167.6 cm)  Ht Readings from Last 3 Encounters:  09/12/22 '5\' 6"'$  (1.676 m)  09/07/21 '5\' 6"'$  (1.676 m)  11/10/20 '5\' 6"'$  (1.676 m)    General appearance: alert, cooperative and appears stated age Head: Normocephalic, without obvious abnormality, atraumatic Neck: no adenopathy, supple, symmetrical, trachea midline and thyroid normal to inspection and palpation Lungs: clear to auscultation bilaterally Cardiovascular: regular rate and rhythm Breasts: normal appearance, no  masses or tenderness Abdomen: soft, non-tender; non distended,  no masses,  no organomegaly Extremities: extremities normal, atraumatic, no cyanosis or edema Skin: Skin color, texture, turgor normal. No rashes or lesions Lymph nodes: Cervical, supraclavicular, and axillary nodes normal. No abnormal inguinal nodes palpated Neurologic: Grossly normal   Pelvic: External genitalia:  no lesions              Urethra:  normal appearing urethra with no masses, tenderness or lesions              Bartholins and Skenes: normal                 Vagina: normal appearing vagina with normal color and discharge, no lesions              Cervix: no lesions               Bimanual Exam:  Uterus:  normal size, contour, position, consistency, mobility, non-tender              Adnexa: no mass, fullness, tenderness               Rectovaginal: Confirms               Anus:  normal sphincter tone, no lesions  Kimalexis, RMA chaperoned for the  exam.  1. Well woman exam Discussed breast self exam Discussed calcium and vit D intake Mammogram, colonoscopy, labs and pap are UTD She will call if she needs a refill of Valtrex.  2. Vaginal atrophy Doing well with estrogen - estradiol (ESTRACE) 0.1 MG/GM vaginal cream; Place one gram vaginally 2 x a week at hs  Dispense: 42.5 g; Refill: 1

## 2022-09-12 ENCOUNTER — Other Ambulatory Visit (HOSPITAL_COMMUNITY): Payer: Self-pay

## 2022-09-12 ENCOUNTER — Ambulatory Visit (INDEPENDENT_AMBULATORY_CARE_PROVIDER_SITE_OTHER): Payer: 59 | Admitting: Obstetrics and Gynecology

## 2022-09-12 ENCOUNTER — Encounter: Payer: Self-pay | Admitting: Obstetrics and Gynecology

## 2022-09-12 VITALS — BP 104/70 | HR 66 | Ht 66.0 in | Wt 137.0 lb

## 2022-09-12 DIAGNOSIS — Z01419 Encounter for gynecological examination (general) (routine) without abnormal findings: Secondary | ICD-10-CM | POA: Diagnosis not present

## 2022-09-12 DIAGNOSIS — N952 Postmenopausal atrophic vaginitis: Secondary | ICD-10-CM | POA: Diagnosis not present

## 2022-09-12 MED ORDER — ZOLPIDEM TARTRATE 10 MG PO TABS
10.0000 mg | ORAL_TABLET | Freq: Every evening | ORAL | 1 refills | Status: AC | PRN
  Filled 2022-09-12: qty 30, 30d supply, fill #0

## 2022-09-12 MED ORDER — ESTRADIOL 0.1 MG/GM VA CREA: TOPICAL_CREAM | VAGINAL | 1 refills | Status: DC

## 2022-09-12 NOTE — Patient Instructions (Signed)

## 2022-09-20 DIAGNOSIS — H52223 Regular astigmatism, bilateral: Secondary | ICD-10-CM | POA: Diagnosis not present

## 2022-09-20 DIAGNOSIS — H5203 Hypermetropia, bilateral: Secondary | ICD-10-CM | POA: Diagnosis not present

## 2022-09-20 DIAGNOSIS — H524 Presbyopia: Secondary | ICD-10-CM | POA: Diagnosis not present

## 2022-12-06 LAB — CBC AND DIFFERENTIAL
HCT: 39 (ref 36–46)
Hemoglobin: 13.3 (ref 12.0–16.0)
Platelets: 207 10*3/uL (ref 150–400)
WBC: 5.4

## 2022-12-06 LAB — COMPREHENSIVE METABOLIC PANEL: Calcium: 9.4 (ref 8.7–10.7)

## 2022-12-06 LAB — BASIC METABOLIC PANEL
BUN: 17 (ref 4–21)
CO2: 26 — AB (ref 13–22)
Chloride: 102 (ref 99–108)
Creatinine: 0.9 (ref 0.5–1.1)
Glucose: 71
Glucose: 71
Potassium: 4.2 mEq/L (ref 3.5–5.1)
Sodium: 142 (ref 137–147)

## 2022-12-06 LAB — LIPID PANEL
Cholesterol: 199 (ref 0–200)
HDL: 95 — AB (ref 35–70)
LDL Cholesterol: 91
Triglycerides: 75 (ref 40–160)

## 2022-12-06 LAB — HEPATIC FUNCTION PANEL
ALT: 21 U/L (ref 7–35)
AST: 30 (ref 13–35)
Alkaline Phosphatase: 59 (ref 25–125)

## 2022-12-06 LAB — CBC: RBC: 4.19 (ref 3.87–5.11)

## 2022-12-06 LAB — TSH: TSH: 1.53 (ref 0.41–5.90)

## 2022-12-06 LAB — HEMOGLOBIN A1C: Hemoglobin A1C: 5.5

## 2023-01-29 IMAGING — DX DG HIP (WITH OR WITHOUT PELVIS) 2-3V*R*
2 series · 2 of 2 positions shown · non-contrast
Comparison: None.

CLINICAL DATA: Right hip pain.

EXAM:
DG HIP (WITH OR WITHOUT PELVIS) 2-3V RIGHT

[dg hip unilat w or w/o pelvis 2-3 views  (1 of 2)]
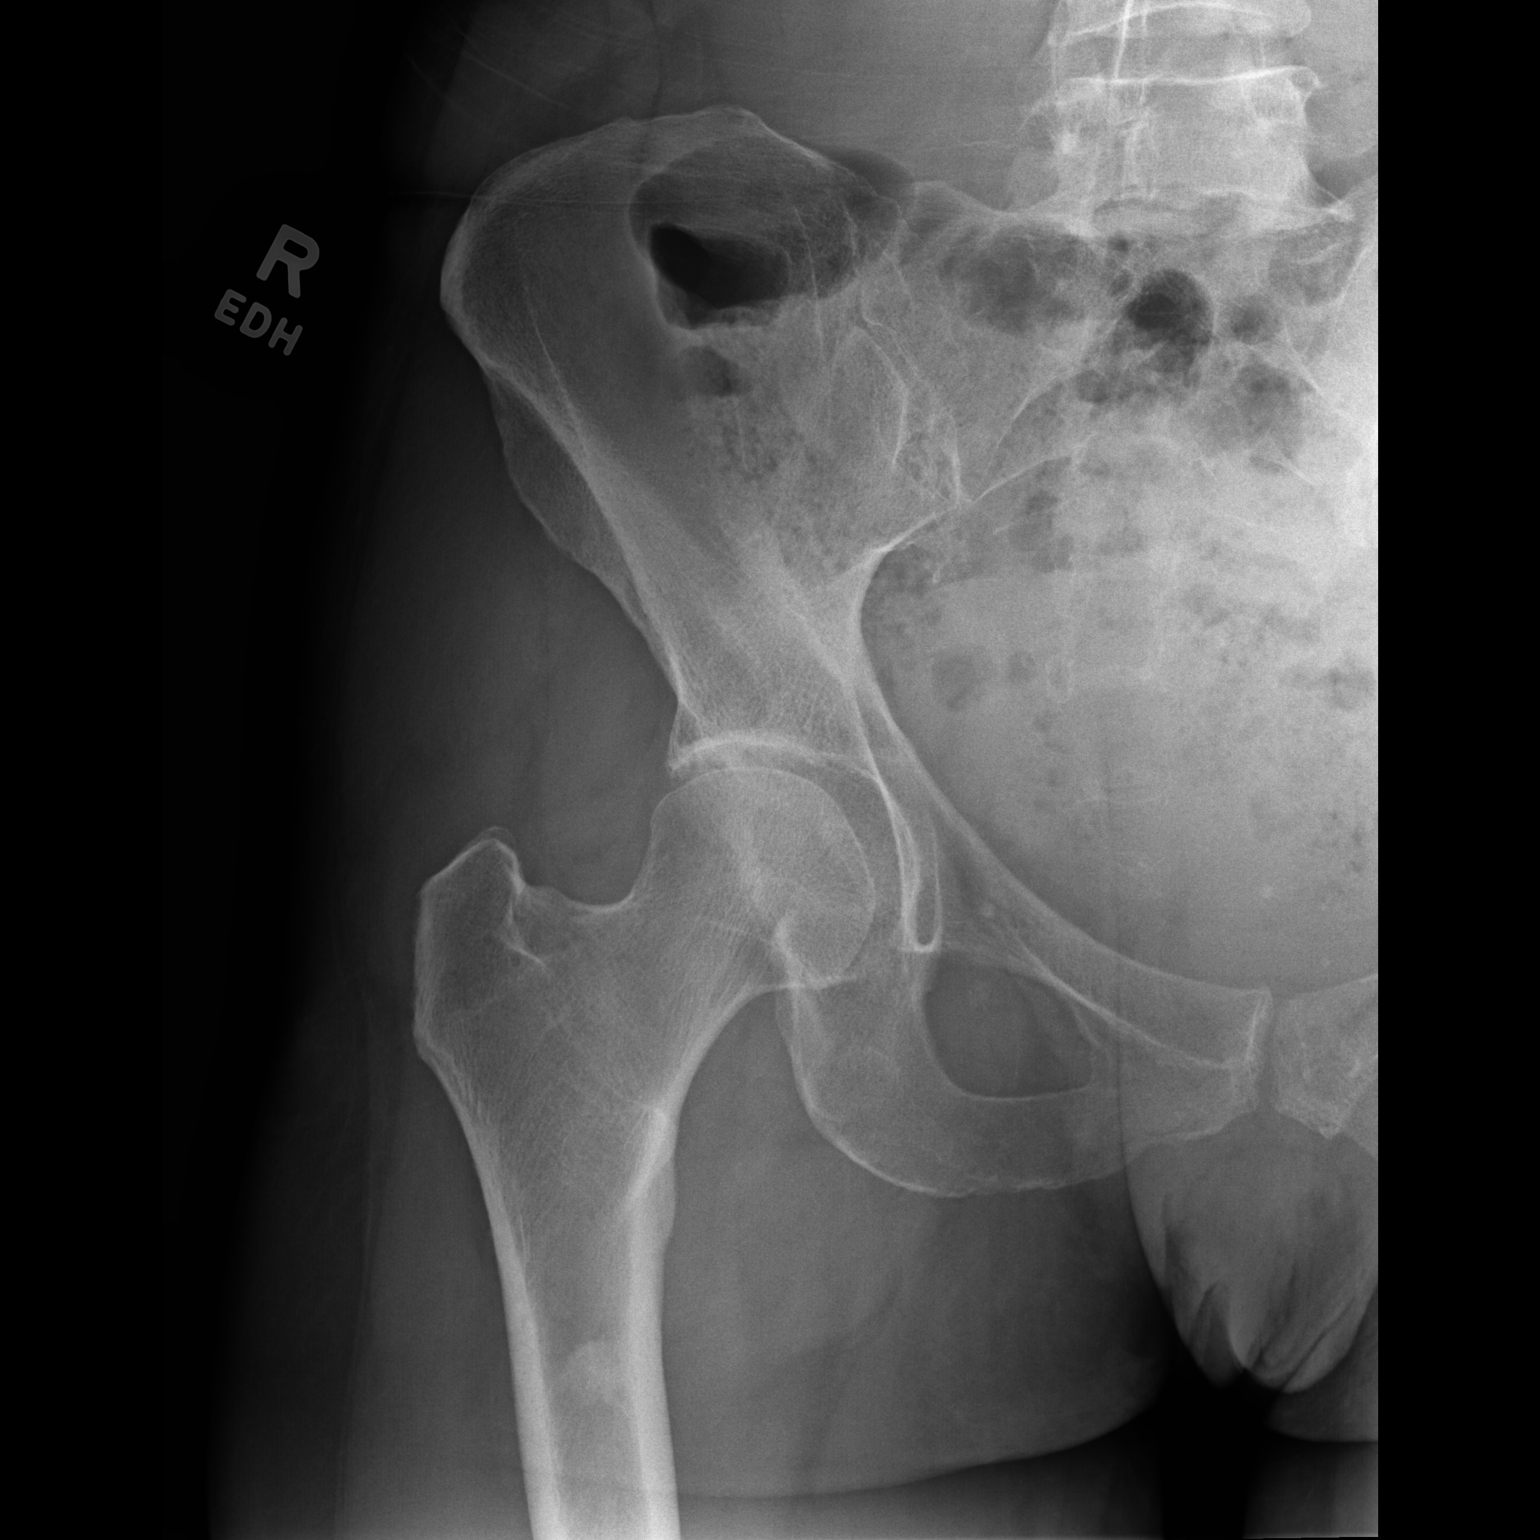

[dg hip unilat w or w/o pelvis 2-3 views  (2 of 2)]
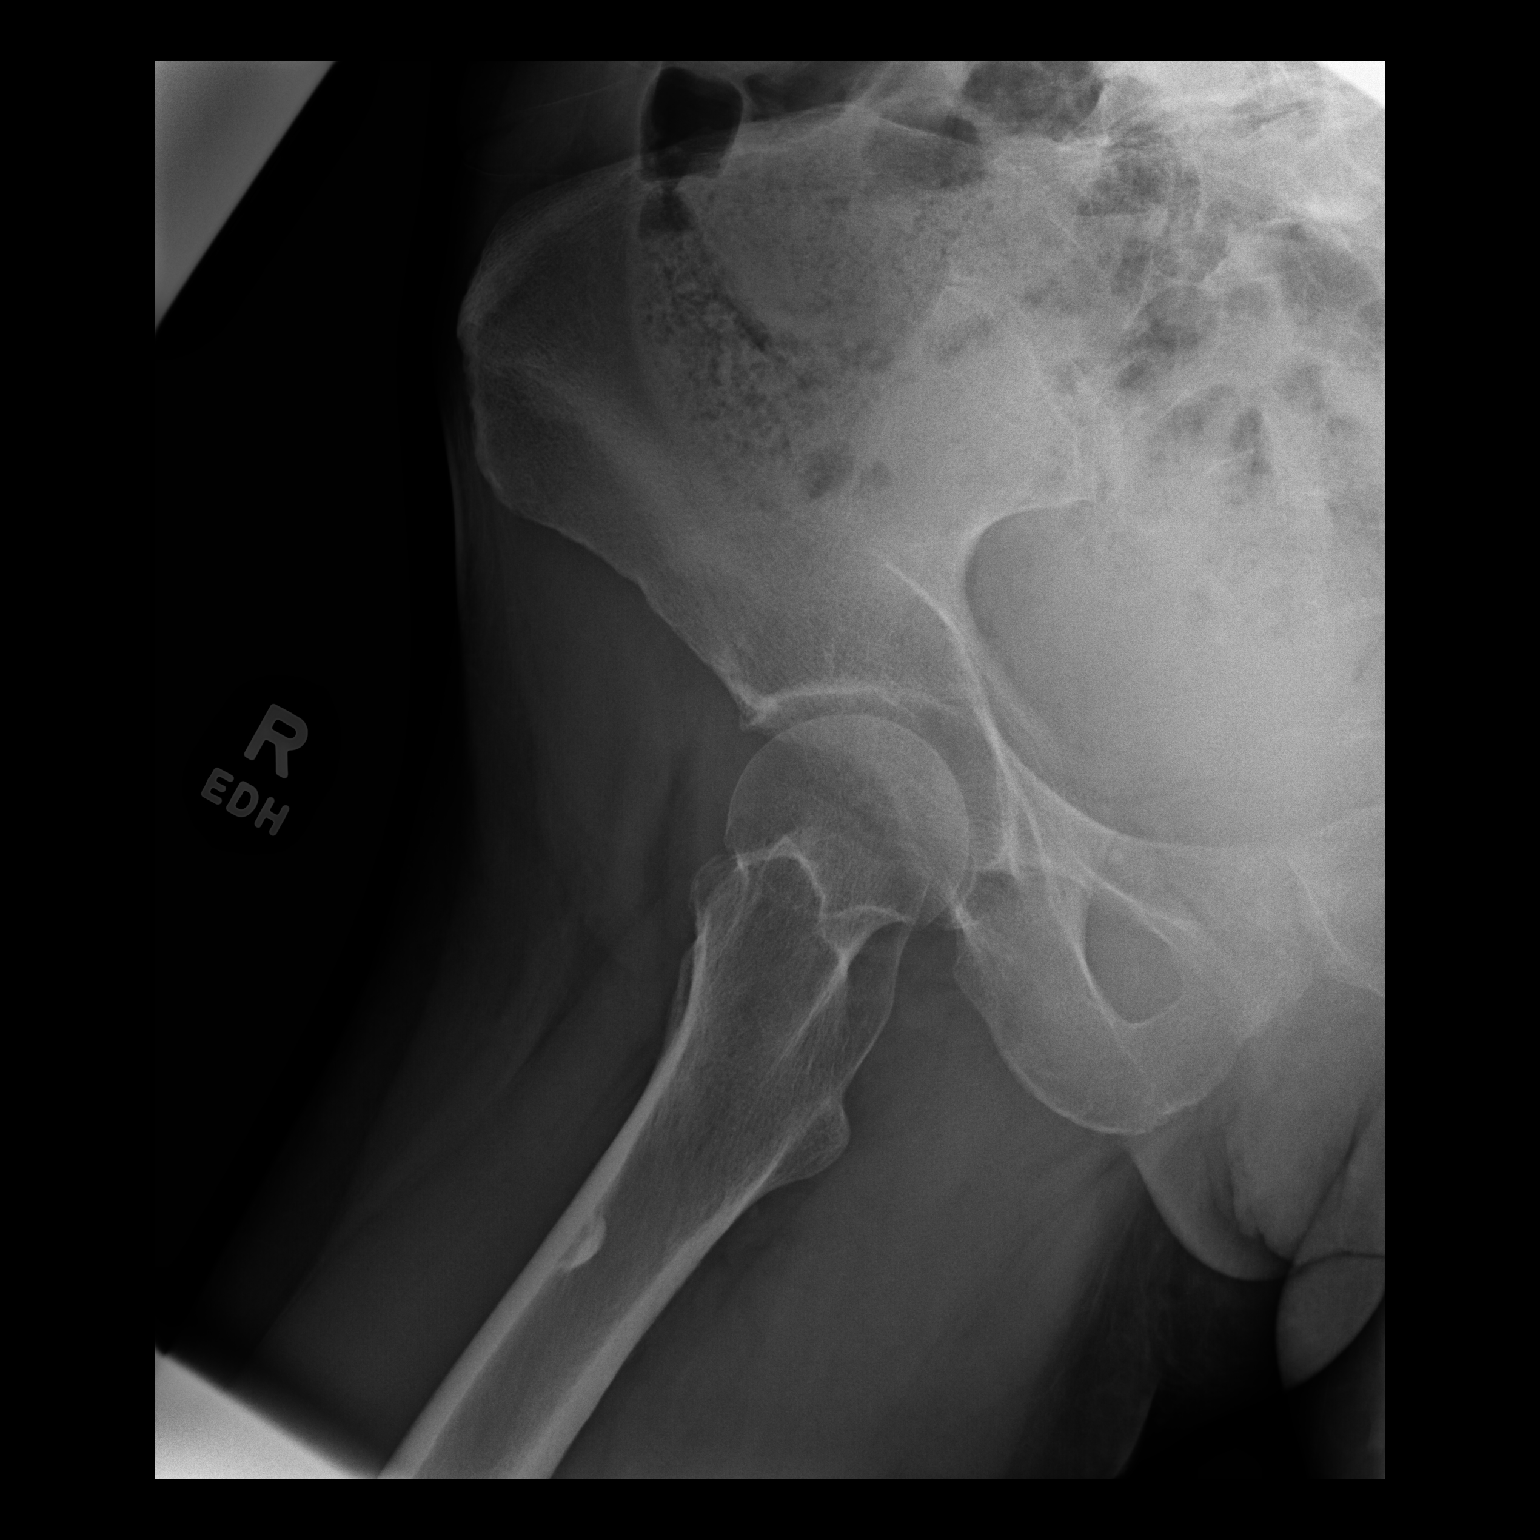

[2 of 2 positions shown; findings below may reference images not displayed]

FINDINGS: Mild degenerative changes are noted of the right hip. There is no
acute displaced fracture or dislocation. The osseous mineralization
is within normal limits. There is a sclerotic lesion measuring
cm involving the proximal right femur. This lesion demonstrates a
narrow zone of transition and appears nonaggressive.
IMPRESSION: 1. No acute abnormality.
2. Mild degenerative changes are noted of the patient's right hip.
3. There is a 1.5 cm benign-appearing sclerotic lesion involving the
proximal right femur. This is favored to represent a bone island in
the absence of any known prior malignancy. A 3-6 month follow-up
x-ray is recommended to confirm stability.

## 2023-02-27 ENCOUNTER — Encounter: Payer: Commercial Managed Care - PPO | Admitting: Internal Medicine

## 2023-03-06 ENCOUNTER — Encounter: Payer: Self-pay | Admitting: Internal Medicine

## 2023-03-06 ENCOUNTER — Other Ambulatory Visit (HOSPITAL_COMMUNITY): Payer: Self-pay

## 2023-03-06 ENCOUNTER — Ambulatory Visit (INDEPENDENT_AMBULATORY_CARE_PROVIDER_SITE_OTHER): Payer: 59 | Admitting: Internal Medicine

## 2023-03-06 VITALS — BP 110/70 | HR 67 | Temp 98.2°F | Ht 66.0 in | Wt 136.3 lb

## 2023-03-06 DIAGNOSIS — N952 Postmenopausal atrophic vaginitis: Secondary | ICD-10-CM | POA: Diagnosis not present

## 2023-03-06 DIAGNOSIS — Z0000 Encounter for general adult medical examination without abnormal findings: Secondary | ICD-10-CM

## 2023-03-06 DIAGNOSIS — A6000 Herpesviral infection of urogenital system, unspecified: Secondary | ICD-10-CM

## 2023-03-06 MED ORDER — ESTRADIOL 0.1 MG/GM VA CREA: TOPICAL_CREAM | VAGINAL | 1 refills | Status: DC

## 2023-03-06 MED ORDER — VALACYCLOVIR HCL 500 MG PO TABS
500.0000 mg | ORAL_TABLET | Freq: Two times a day (BID) | ORAL | 0 refills | Status: AC
  Filled 2023-03-06: qty 30, 15d supply, fill #0

## 2023-03-06 MED ORDER — VALACYCLOVIR HCL 500 MG PO TABS
500.0000 mg | ORAL_TABLET | Freq: Two times a day (BID) | ORAL | 0 refills | Status: DC
  Filled 2023-03-06: qty 180, 90d supply, fill #0

## 2023-03-06 NOTE — Progress Notes (Signed)
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 Past Medical History is significant for: insomnia with infrequent ambien use. Herpes with rare flares for which she uses valtrex and vaginal atrophy on estrace cream. She has been doing well, without concerns or complaints.   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 She has never used smokeless tobacco. She reports current alcohol use of about 7.0 standard drinks of alcohol per week. She reports that she does not use drugs.  Allergies: No Known Allergies  Family History:  Family History  Problem Relation Age of Onset   Kidney disease Maternal Grandfather        polycystic kidneys   Lung cancer Mother    Heart Problems Father    Colon cancer Neg Hx    Colon polyps Neg Hx    Esophageal cancer Neg Hx    Rectal cancer Neg Hx    Stomach cancer Neg Hx      Current Outpatient Medications:    zolpidem (AMBIEN) 10 MG tablet, Take 1 tablet (10 mg total) by mouth at bedtime as needed for sleep., Disp: 30 tablet, Rfl: 1   estradiol (ESTRACE) 0.1 MG/GM vaginal cream, Place one gram vaginally 2 x a week at hs, Disp: 42.5 g, Rfl: 1   valACYclovir (VALTREX) 500 MG tablet, Take 1 tablet (500 mg total) by mouth 2 (two) times daily. X 3 days prn, Disp: 30 tablet, Rfl: 0  Review of Systems:  Negative unless indicated in HPI.   Physical Exam: Vitals:   03/06/23 1056  BP: 110/70  Pulse: 67  Temp: 98.2 F (36.8 C)  TempSrc: Oral  SpO2: 98%  Weight: 136 lb 4.8 oz (61.8 kg)  Height: 5\' 6"  (1.676  m)    Body mass index is 22 kg/m.   Physical Exam Vitals reviewed.  Constitutional:      General: She is not in acute distress.    Appearance: Normal appearance. She is not ill-appearing, toxic-appearing or diaphoretic.  HENT:     Head: Normocephalic.     Right Ear: Tympanic membrane, ear canal and external ear normal. There is no impacted cerumen.     Left Ear: Tympanic membrane, ear canal and external ear normal. There is no impacted cerumen.     Nose: Nose normal.     Mouth/Throat:     Mouth: Mucous membranes are moist.     Pharynx: Oropharynx is clear. No oropharyngeal exudate or posterior oropharyngeal erythema.  Eyes:     General: No scleral icterus.       Right eye: No discharge.        Left eye: No discharge.     Conjunctiva/sclera: Conjunctivae normal.     Pupils: Pupils are equal, round, and reactive to light.  Neck:     Vascular: No carotid bruit.  Cardiovascular:     Rate and Rhythm: Normal rate and regular rhythm.     Pulses: Normal pulses.     Heart  sounds: Normal heart sounds.  Pulmonary:     Effort: Pulmonary effort is normal. No respiratory distress.     Breath sounds: Normal breath sounds.  Abdominal:     General: Abdomen is flat. Bowel sounds are normal.     Palpations: Abdomen is soft.  Musculoskeletal:        General: Normal range of motion.     Cervical back: Normal range of motion.  Skin:    General: Skin is warm and dry.  Neurological:     General: No focal deficit present.     Mental Status: She is alert and oriented to person, place, and time. Mental status is at baseline.  Psychiatric:        Mood and Affect: Mood normal.        Behavior: Behavior normal.        Thought Content: Thought content normal.        Judgment: Judgment normal.     Flowsheet Row Office Visit from 03/06/2023 in [facility] [place]  PHQ-9 Total Score 1       Impression and Plan:  Encounter for preventive health examination  Vaginal  atrophy -     Estradiol; Place one gram vaginally 2 x a week at hs  Dispense: 42.5 g; Refill: 1  Genital herpes simplex, unspecified site -     valACYclovir HCl; Take 1 tablet (500 mg total) by mouth 2 (two) times daily. X 3 days prn  Dispense: 30 tablet; Refill: 0     -Recommend routine eye and dental care. -Healthy lifestyle discussed in detail. -Labs to be updated today. -Prostate cancer screening: N/A Health Maintenance  Topic Date Due   HIV Screening  Never done   Hepatitis C Screening  Never done   Flu Shot  04/26/2023   Mammogram  03/01/2024   Pap Smear  09/07/2024   Colon Cancer Screening  12/16/2024   DTaP/Tdap/Td vaccine (2 - Td or Tdap) 05/31/2026   COVID-19 Vaccine  Completed   Zoster (Shingles) Vaccine  Completed   HPV Vaccine  Aged Out    -Estrace and valtrex prescriptions sent. -Labs from doctor's day will be abstracted.     Chaya Jan, MD [facility]

## 2023-03-08 ENCOUNTER — Encounter: Payer: Self-pay | Admitting: Internal Medicine

## 2023-03-15 ENCOUNTER — Ambulatory Visit (HOSPITAL_BASED_OUTPATIENT_CLINIC_OR_DEPARTMENT_OTHER)
Admission: RE | Admit: 2023-03-15 | Discharge: 2023-03-15 | Disposition: A | Discharge status: Home / Self Care | Payer: 59 | Source: Ambulatory Visit | Attending: Internal Medicine | Admitting: Internal Medicine

## 2023-03-15 ENCOUNTER — Other Ambulatory Visit (HOSPITAL_BASED_OUTPATIENT_CLINIC_OR_DEPARTMENT_OTHER): Payer: Self-pay | Admitting: Internal Medicine

## 2023-03-15 DIAGNOSIS — Z1231 Encounter for screening mammogram for malignant neoplasm of breast: Secondary | ICD-10-CM

## 2023-05-30 ENCOUNTER — Other Ambulatory Visit (HOSPITAL_COMMUNITY): Payer: Self-pay

## 2023-06-13 ENCOUNTER — Encounter: Payer: Self-pay | Admitting: Internal Medicine

## 2023-11-13 ENCOUNTER — Other Ambulatory Visit: Payer: Self-pay

## 2024-01-16 LAB — COMPREHENSIVE METABOLIC PANEL WITH GFR
Albumin: 4.7 (ref 3.5–5.0)
Calcium: 9.7 (ref 8.7–10.7)
Globulin: 2.1
eGFR: 65

## 2024-01-16 LAB — CBC AND DIFFERENTIAL
HCT: 43 (ref 36–46)
Hemoglobin: 13.8 (ref 12.0–16.0)
WBC: 7.5

## 2024-01-16 LAB — TSH: TSH: 1.5 (ref 0.41–5.90)

## 2024-01-16 LAB — HEPATIC FUNCTION PANEL
ALT: 16 U/L (ref 7–35)
AST: 28 (ref 13–35)
Alkaline Phosphatase: 72 (ref 25–125)
Bilirubin, Total: 1.6

## 2024-01-16 LAB — BASIC METABOLIC PANEL WITH GFR
BUN: 16 (ref 4–21)
CO2: 24 — AB (ref 13–22)
Chloride: 101 (ref 99–108)
Creatinine: 1 (ref 0.5–1.1)
Glucose: 85
Potassium: 4.6 meq/L (ref 3.5–5.1)
Sodium: 140 (ref 137–147)

## 2024-01-16 LAB — LIPID PANEL
Cholesterol: 220 — AB (ref 0–200)
HDL: 100 — AB (ref 35–70)
LDL Cholesterol: 112
Triglycerides: 43 (ref 40–160)

## 2024-01-16 LAB — HEMOGLOBIN A1C: Hemoglobin A1C: 5.3

## 2024-01-16 LAB — CBC: RBC: 4.56 (ref 3.87–5.11)

## 2024-05-19 IMAGING — MG MM DIGITAL SCREENING BILAT W/ TOMO AND CAD
6 of 10 series · 6 of 30 positions shown · non-contrast
Comparison: Previous exam(s).

CLINICAL DATA: Screening.

EXAM:
DIGITAL SCREENING BILATERAL MAMMOGRAM WITH TOMOSYNTHESIS AND CAD
TECHNIQUE: Bilateral screening digital craniocaudal and mediolateral oblique
mammograms were obtained. Bilateral screening digital breast
tomosynthesis was performed. The images were evaluated with
computer-aided detection.

[R MLO synth-2D (1 of 2)]
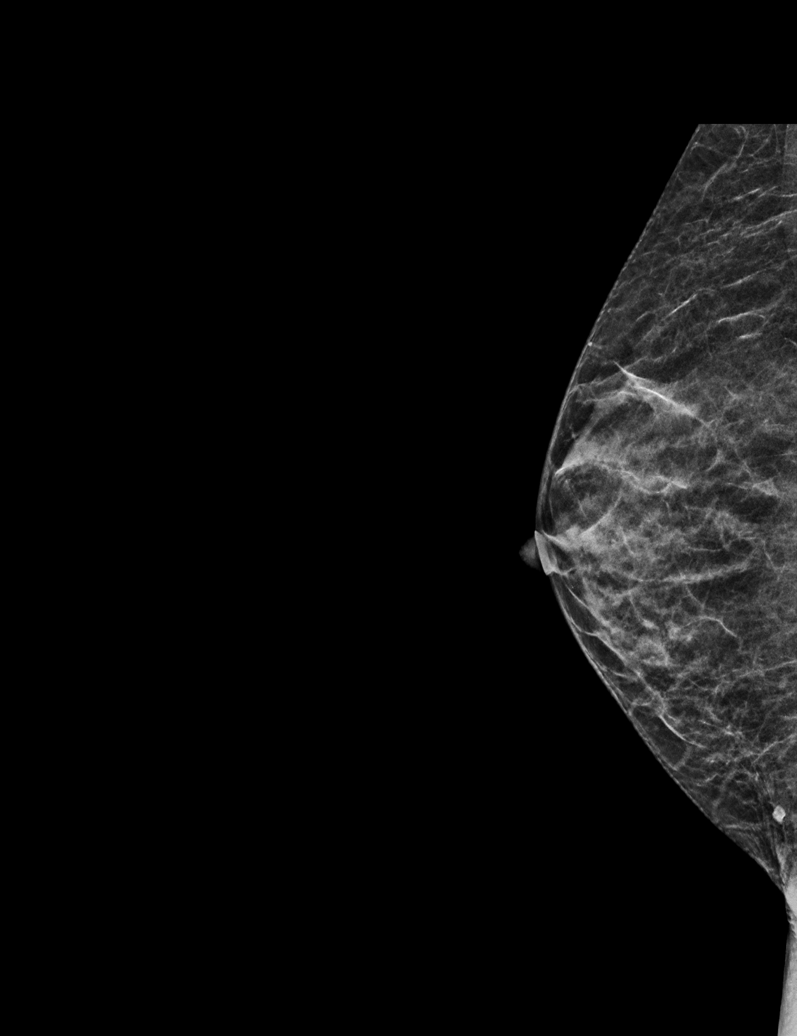

[R CC synth-2D]
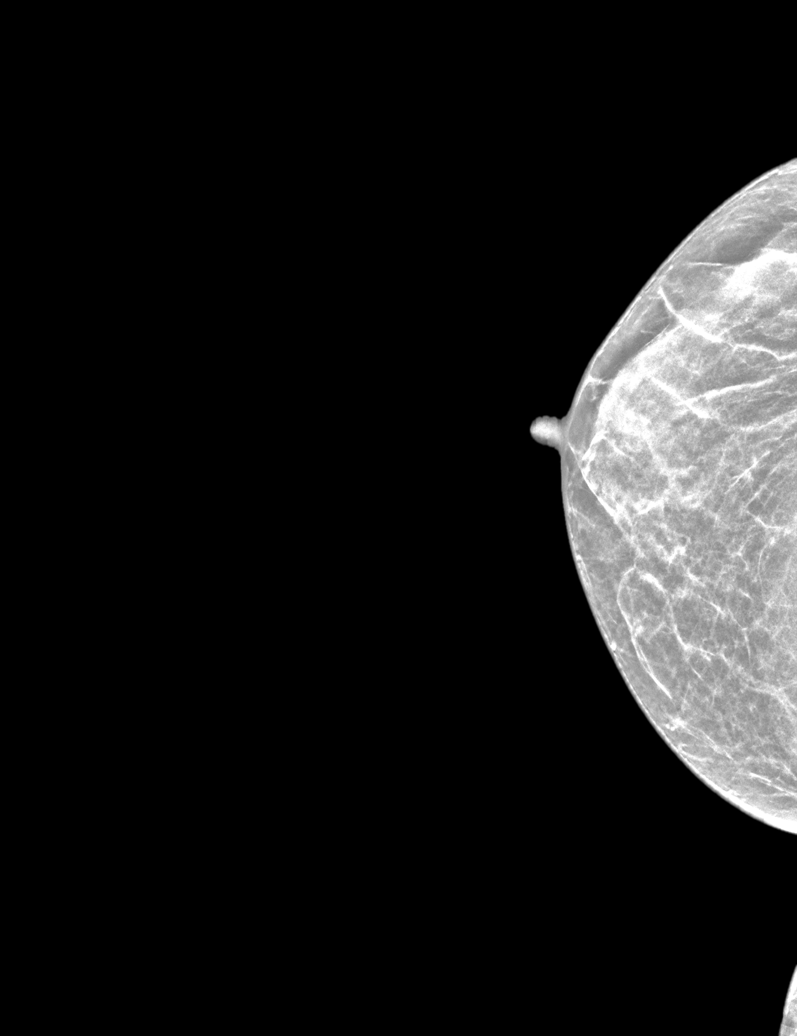

[L MLO synth-2D]
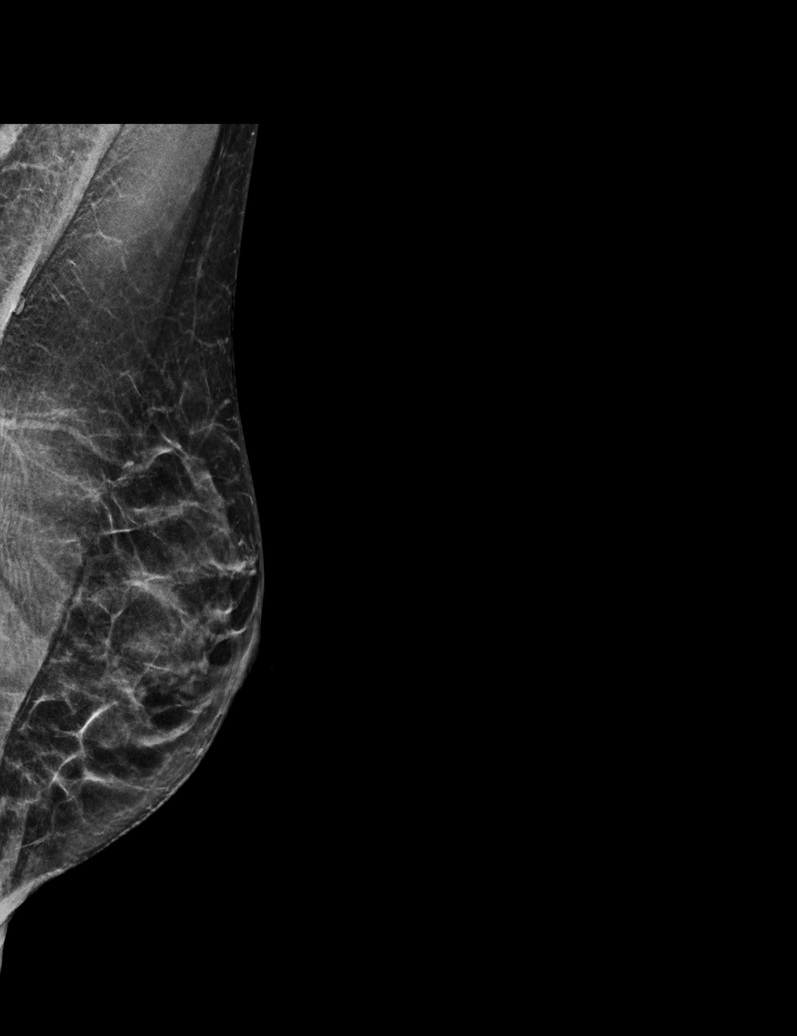

[R MLO synth-2D (2 of 2)]
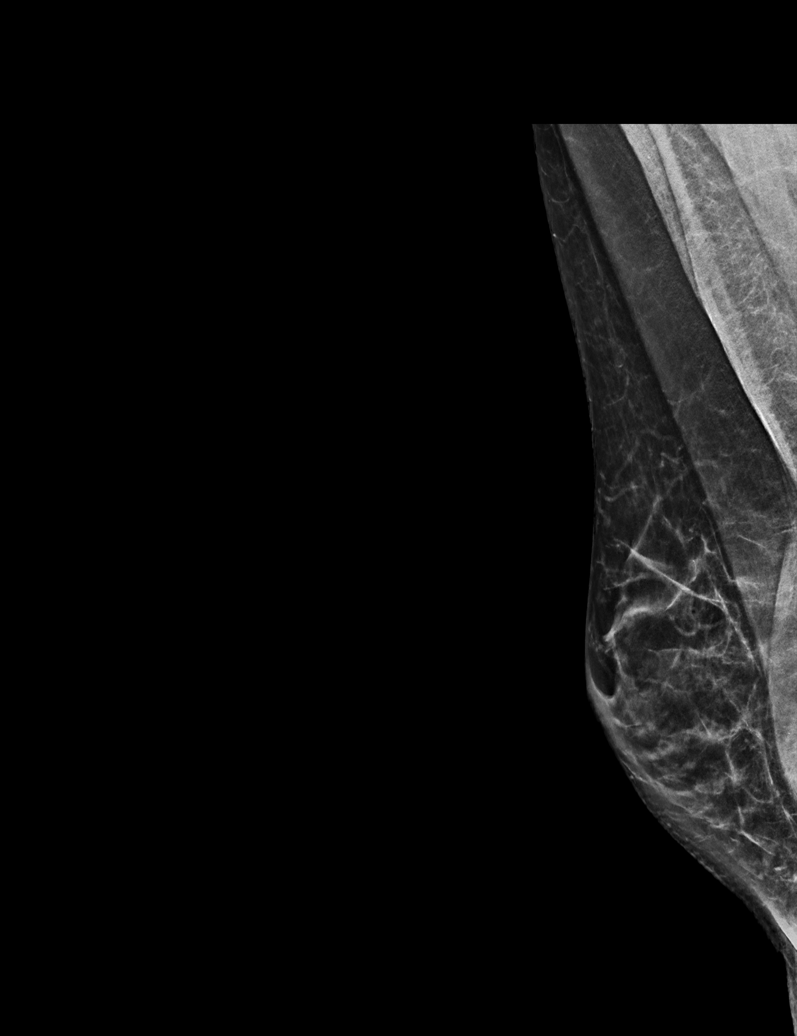

[L CC synth-2D]
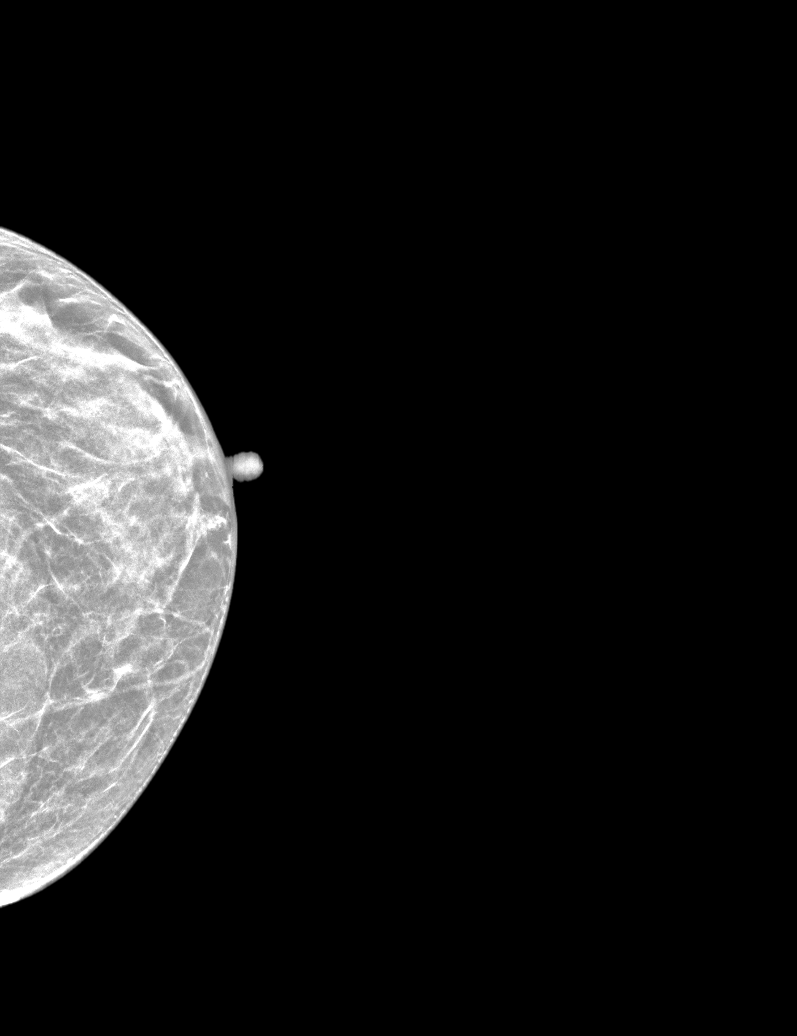

[L MLO tomo · tomo slice 25/49.0]
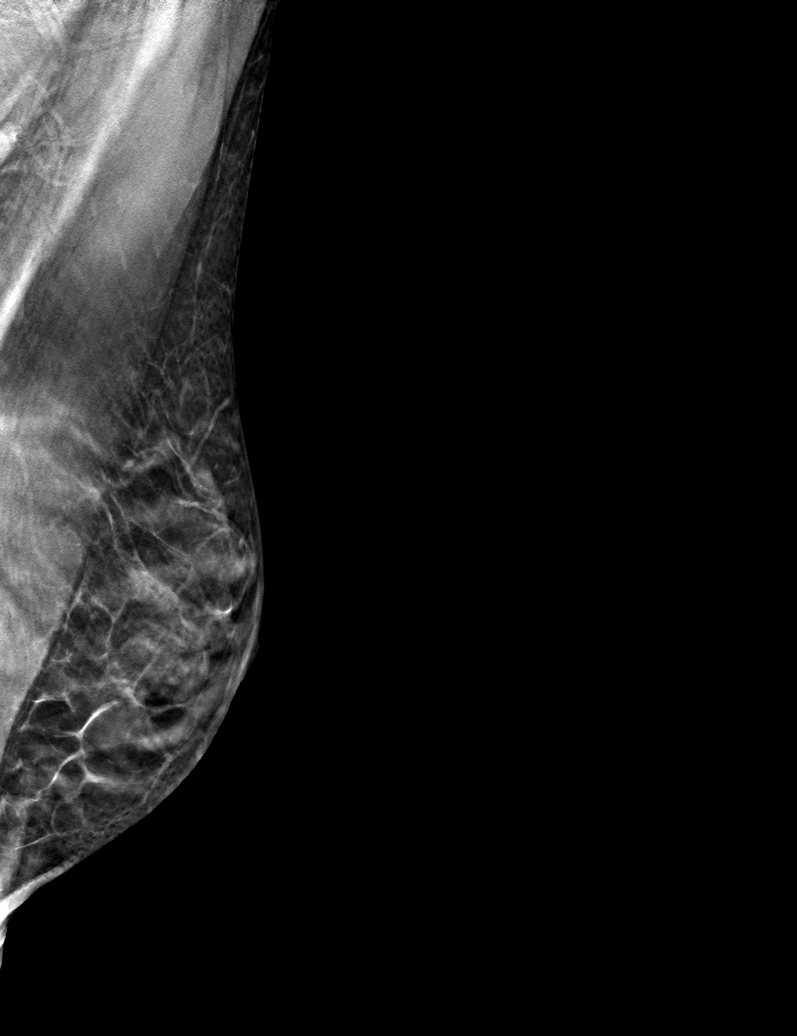

[6 of 30 positions shown; findings below may reference images not displayed]

ACR Breast Density Category c: The breast tissue is heterogeneously
dense, which may obscure small masses.
FINDINGS: There are no findings suspicious for malignancy.
IMPRESSION: No mammographic evidence of malignancy. A result letter of this
screening mammogram will be mailed directly to the patient.

RECOMMENDATION:
Screening mammogram in one year. (Code:Q3-W-BC3)

BI-RADS CATEGORY  1: Negative.

## 2024-06-26 ENCOUNTER — Ambulatory Visit (INDEPENDENT_AMBULATORY_CARE_PROVIDER_SITE_OTHER): Admitting: Internal Medicine

## 2024-06-26 ENCOUNTER — Encounter: Payer: Self-pay | Admitting: Internal Medicine

## 2024-06-26 VITALS — BP 108/74 | HR 70 | Temp 98.0°F | Ht 66.0 in | Wt 136.6 lb

## 2024-06-26 DIAGNOSIS — N952 Postmenopausal atrophic vaginitis: Secondary | ICD-10-CM | POA: Diagnosis not present

## 2024-06-26 DIAGNOSIS — Z0000 Encounter for general adult medical examination without abnormal findings: Secondary | ICD-10-CM

## 2024-06-26 DIAGNOSIS — Z23 Encounter for immunization: Secondary | ICD-10-CM

## 2024-06-26 DIAGNOSIS — Z1231 Encounter for screening mammogram for malignant neoplasm of breast: Secondary | ICD-10-CM

## 2024-06-26 DIAGNOSIS — Z780 Asymptomatic menopausal state: Secondary | ICD-10-CM

## 2024-06-26 MED ORDER — ESTRADIOL 0.1 MG/GM VA CREA: TOPICAL_CREAM | VAGINAL | 1 refills | Status: AC

## 2024-06-26 NOTE — Addendum Note (Signed)
 Addended by: KATHRYNE MILLMAN B on: 06/26/2024 09:55 AM   Modules accepted: Orders

## 2024-06-26 NOTE — Addendum Note (Signed)
 Addended by: KATHRYNE MILLMAN B on: 06/26/2024 09:37 AM   Modules accepted: Orders

## 2024-06-26 NOTE — Addendum Note (Signed)
 Addended by: KATHRYNE MILLMAN B on: 06/26/2024 09:17 AM   Modules accepted: Orders

## 2024-06-26 NOTE — Progress Notes (Signed)
 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  No past medical history of significance other than vaginal atrophy for which she uses estradiol  cream.  Had recent labs that she will forward to us .  Is overdue for mammogram and bone density.  Is due for flu, pneumonia and COVID vaccinations.   Past Medical/Surgical History: Past Medical History:  Diagnosis Date   Elevated bilirubin August 2016   H/O abnormal Pap smear    age 57   Malaria 1991    Past Surgical History:  Procedure Laterality Date   COLONOSCOPY  11/09   Dr.Brodie    GYNECOLOGIC CRYOSURGERY  1980   [facility]    Social History:  reports that she has never smoked. She has never used smokeless tobacco. She reports current alcohol use of about 7.0 standard drinks of alcohol per week. She reports that she does not use drugs.  Allergies: No Known Allergies  Family History:  Family History  Problem Relation Age of Onset   Kidney disease Maternal Grandfather        polycystic kidneys   Lung cancer Mother    Heart Problems Father    Colon cancer Neg Hx    Colon polyps Neg Hx    Esophageal cancer Neg Hx    Rectal cancer Neg Hx    Stomach cancer Neg Hx      Current Outpatient Medications:    valACYclovir  (VALTREX ) 500 MG tablet, Take 1 tablet (500 mg total) by mouth 2 (two) times daily for 3 days as needed, Disp: 30 tablet, Rfl: 0   zolpidem  (AMBIEN ) 10 MG tablet, Take 1 tablet (10 mg total) by mouth at bedtime as needed for sleep., Disp: 30 tablet, Rfl: 1   estradiol  (ESTRACE ) 0.1 MG/GM vaginal cream, Place one gram vaginally 2 x a week at hs, Disp: 42.5 g, Rfl: 1  Review of Systems:  Negative unless indicated in HPI.   Physical Exam: Vitals:   06/26/24 0846  BP: 108/74  Pulse: 70  Temp: 98 F (36.7 C)  TempSrc: Oral  SpO2: 99%  Weight: 136 lb 9.6 oz (62  kg)  Height: 5' 6 (1.676 m)    Body mass index is 22.05 kg/m.   Physical Exam Vitals reviewed.  Constitutional:      General: She is not in acute distress.    Appearance: Normal appearance. She is not ill-appearing, toxic-appearing or diaphoretic.  HENT:     Head: Normocephalic.     Right Ear: Tympanic membrane, ear canal and external ear normal. There is no impacted cerumen.     Left Ear: Tympanic membrane, ear canal and external ear normal. There is no impacted cerumen.     Nose: Nose normal.     Mouth/Throat:     Mouth: Mucous membranes are moist.     Pharynx: Oropharynx is clear. No oropharyngeal exudate or posterior oropharyngeal erythema.  Eyes:     General: No scleral icterus.       Right eye: No discharge.        Left eye: No discharge.     Conjunctiva/sclera: Conjunctivae normal.     Pupils: Pupils are equal, round, and reactive to light.  Neck:     Vascular: No carotid bruit.  Cardiovascular:     Rate and Rhythm: Normal rate and regular rhythm.  Pulses: Normal pulses.     Heart sounds: Normal heart sounds.  Pulmonary:     Effort: Pulmonary effort is normal. No respiratory distress.     Breath sounds: Normal breath sounds.  Abdominal:     General: Abdomen is flat. Bowel sounds are normal.     Palpations: Abdomen is soft.  Musculoskeletal:        General: Normal range of motion.     Cervical back: Normal range of motion.  Skin:    General: Skin is warm and dry.  Neurological:     General: No focal deficit present.     Mental Status: She is alert and oriented to person, place, and time. Mental status is at baseline.  Psychiatric:        Mood and Affect: Mood normal.        Behavior: Behavior normal.        Thought Content: Thought content normal.        Judgment: Judgment normal.   Welcome to Medicare wellness visit   1. Risk factors, based on past  M,S,F - Cardiac Risk Factors include: advanced age (>63men, >70 women)   2.  Physical  activities: Dietary issues and exercise activities discussed:      3.  Depression/mood:  Flowsheet Row Office Visit from 03/06/2023 in [facility] Total Score 1     4.  ADL's:    06/26/2024    8:38 AM  In your present state of health, do you have any difficulty performing the following activities:  Hearing? 0  Vision? 0  Difficulty concentrating or making decisions? 0  Walking or climbing stairs? 0  Dressing or bathing? 0  Doing errands, shopping? 0  Preparing Food and eating ? N  Using the Toilet? N  In the past six months, have you accidently leaked urine? N  Do you have problems with loss of bowel control? N  Managing your Medications? N  Managing your Finances? N  Housekeeping or managing your Housekeeping? N     5.  Fall risk:     03/06/2023   10:55 AM 06/26/2024    8:40 AM  Fall Risk  Falls in the past year? 0 0  Was there an injury with Fall? 0 0  Fall Risk Category Calculator 0 0  Fall risk Follow up Falls evaluation completed Falls evaluation completed     6.  Home safety: No problems identified   7.  Height weight, and visual acuity: height and weight as above, vision/hearing: Vision Screening   Right eye Left eye Both eyes  Without correction     With correction 20/20 20/20 20/20      8.  Counseling: Counseling given: Not Answered    9. Lab orders based on risk factors: Laboratory update will be reviewed   10. Cognitive assessment:        06/26/2024    8:41 AM  6CIT Screen  What Year? 0 points  What month? 0 points  What time? 0 points  Count back from 20 0 points  Months in reverse 0 points  Repeat phrase 0 points  Total Score 0 points     11. Screening: Patient provided with a written and personalized 5-10 year screening schedule in the AVS. Health Maintenance  Topic Date Due   Hepatitis C Screening  Never done   Pneumococcal Vaccine for age over 41 (1 of 1 - PCV) Never done   DEXA scan (bone  density  measurement)  Never done   Flu Shot  04/25/2024   COVID-19 Vaccine (4 - 2025-26 season) 05/26/2024   Colon Cancer Screening  12/16/2024   Breast Cancer Screening  03/14/2025   Medicare Annual Wellness Visit  06/26/2025   DTaP/Tdap/Td vaccine (2 - Td or Tdap) 05/31/2026   Zoster (Shingles) Vaccine  Completed   HPV Vaccine  Aged Out   Meningitis B Vaccine  Aged Out   Hepatitis B Vaccine  Discontinued    12. Provider List Update: Patient Care Team    Relationship Specialty Notifications Start End  Theophilus Andrews, Tully GRADE, MD PCP - General Internal Medicine  10/21/19      13. Advance Directives: Does Patient Have a Medical Advance Directive?: Yes Type of Advance Directive: Healthcare Power of Attorney, Living will, Out of facility DNR (pink MOST or yellow form) Does patient want to make changes to medical advance directive?: No - Patient declined Copy of Healthcare Power of Attorney in Chart?: No - copy requested  14. Opioids: Patient is not on any opioid prescriptions and has no risk factors for a substance use disorder.   15.   Goals      Maintain or improve exercise capacity (6 minute walk)               I have personally reviewed and noted the following in the patient's chart:   Medical and social history Use of alcohol, tobacco or illicit drugs  Current medications and supplements Functional ability and status Nutritional status Physical activity Advanced directives List of other physicians Hospitalizations, surgeries, and ER visits in previous 12 months Vitals Screenings to include cognitive, depression, and falls Referrals and appointments  In addition, I have reviewed and discussed with patient certain preventive protocols, quality metrics, and best practice recommendations. A written personalized care plan for preventive services as well as general preventive health recommendations were provided to patient.   Impression and Plan:  Welcome to  Medicare preventive visit  Vaginal atrophy -     Estradiol ; Place one gram vaginally 2 x a week at hs  Dispense: 42.5 g; Refill: 1  Encounter for osteoporosis screening in asymptomatic postmenopausal patient -     DG Bone Density; Future  Screening mammogram for breast cancer  Immunization due   -Recommend routine eye and dental care. -Healthy lifestyle discussed in detail. -Labs to be updated today. -Prostate cancer screening: Not applicable Health Maintenance  Topic Date Due   Hepatitis C Screening  Never done   Pneumococcal Vaccine for age over 48 (1 of 1 - PCV) Never done   DEXA scan (bone density measurement)  Never done   Flu Shot  04/25/2024   COVID-19 Vaccine (4 - 2025-26 season) 05/26/2024   Colon Cancer Screening  12/16/2024   Breast Cancer Screening  03/14/2025   Medicare Annual Wellness Visit  06/26/2025   DTaP/Tdap/Td vaccine (2 - Td or Tdap) 05/31/2026   Zoster (Shingles) Vaccine  Completed   HPV Vaccine  Aged Out   Meningitis B Vaccine  Aged Out   Hepatitis B Vaccine  Discontinued     -Flu and PCV 20 in office today. - Sent for DEXA. - She will schedule mammogram.     Tully Theophilus Andrews, MD [facility]

## 2024-07-01 ENCOUNTER — Other Ambulatory Visit: Payer: Self-pay | Admitting: Internal Medicine

## 2024-07-01 DIAGNOSIS — Z1231 Encounter for screening mammogram for malignant neoplasm of breast: Secondary | ICD-10-CM

## 2024-07-17 ENCOUNTER — Ambulatory Visit (INDEPENDENT_AMBULATORY_CARE_PROVIDER_SITE_OTHER)
Admission: RE | Admit: 2024-07-17 | Discharge: 2024-07-17 | Disposition: A | Discharge status: Home / Self Care | Source: Ambulatory Visit | Attending: Internal Medicine | Admitting: Internal Medicine

## 2024-07-17 DIAGNOSIS — Z780 Asymptomatic menopausal state: Secondary | ICD-10-CM | POA: Diagnosis not present

## 2024-07-17 DIAGNOSIS — Z13820 Encounter for screening for osteoporosis: Secondary | ICD-10-CM

## 2024-07-21 ENCOUNTER — Ambulatory Visit: Payer: Self-pay | Admitting: Internal Medicine

## 2024-07-21 ENCOUNTER — Telehealth: Payer: Self-pay | Admitting: *Deleted

## 2024-07-21 NOTE — Telephone Encounter (Signed)
 Copied from CRM [id number]. Topic: Clinical - Lab/Test Results >> Jul 21, 2024  1:57 PM Viola F wrote: Reason for CRM: Patient returned Rachels phone call regarding bone density results

## 2024-07-21 NOTE — Telephone Encounter (Signed)
 Patient states that she would like to know if she can repeat the bone density test in a month or so?  She would like it done at a different facility ([facility]).  She understands that insurance will not pay for the test.  She would like to know if there is any other labs that should be done?  Okay to respond via MyChart.

## 2024-07-24 ENCOUNTER — Ambulatory Visit
Admission: RE | Admit: 2024-07-24 | Discharge: 2024-07-24 | Disposition: A | Discharge status: Home / Self Care | Source: Ambulatory Visit

## 2024-07-24 DIAGNOSIS — Z1231 Encounter for screening mammogram for malignant neoplasm of breast: Secondary | ICD-10-CM
